# Patient Record
Sex: Female | Born: 1945
Health system: Southern US, Community
[De-identification: ages and names within clinical notes are randomized; demographics above are authoritative.]

## PROBLEM LIST (undated history)

## (undated) DIAGNOSIS — I6523 Occlusion and stenosis of bilateral carotid arteries: Secondary | ICD-10-CM

## (undated) DIAGNOSIS — H348122 Central retinal vein occlusion, left eye, stable: Secondary | ICD-10-CM

## (undated) DIAGNOSIS — E538 Deficiency of other specified B group vitamins: Secondary | ICD-10-CM

## (undated) DIAGNOSIS — E785 Hyperlipidemia, unspecified: Secondary | ICD-10-CM

## (undated) DIAGNOSIS — E039 Hypothyroidism, unspecified: Secondary | ICD-10-CM

## (undated) HISTORY — PX: THYROID SURGERY: SHX805

## (undated) HISTORY — DX: Hyperlipidemia, unspecified: E78.5

## (undated) HISTORY — DX: Occlusion and stenosis of bilateral carotid arteries: I65.23

## (undated) HISTORY — PX: OTHER SURGICAL HISTORY: SHX169

## (undated) HISTORY — DX: Deficiency of other specified B group vitamins: E53.8

## (undated) HISTORY — PX: ABDOMINAL HYSTERECTOMY: SHX81

## (undated) HISTORY — DX: Hypothyroidism, unspecified: E03.9

## (undated) HISTORY — DX: Central retinal vein occlusion, left eye, stable: H34.8122

---

## 2011-09-05 ENCOUNTER — Other Ambulatory Visit: Payer: Self-pay | Admitting: Family Medicine

## 2011-09-05 DIAGNOSIS — I6529 Occlusion and stenosis of unspecified carotid artery: Secondary | ICD-10-CM

## 2011-09-08 ENCOUNTER — Ambulatory Visit (HOSPITAL_COMMUNITY)
Admission: RE | Admit: 2011-09-08 | Discharge: 2011-09-08 | Disposition: A | Discharge status: Home / Self Care | Payer: Medicare HMO | Source: Ambulatory Visit | Attending: Family Medicine | Admitting: Family Medicine

## 2011-09-08 DIAGNOSIS — I6529 Occlusion and stenosis of unspecified carotid artery: Secondary | ICD-10-CM

## 2011-09-08 DIAGNOSIS — R0989 Other specified symptoms and signs involving the circulatory and respiratory systems: Secondary | ICD-10-CM | POA: Insufficient documentation

## 2011-09-08 DIAGNOSIS — F17200 Nicotine dependence, unspecified, uncomplicated: Secondary | ICD-10-CM | POA: Insufficient documentation

## 2013-01-07 ENCOUNTER — Other Ambulatory Visit: Payer: Self-pay | Admitting: *Deleted

## 2013-01-07 DIAGNOSIS — Z780 Asymptomatic menopausal state: Secondary | ICD-10-CM

## 2013-01-31 ENCOUNTER — Encounter: Payer: Self-pay | Admitting: *Deleted

## 2013-03-20 ENCOUNTER — Other Ambulatory Visit: Payer: Self-pay

## 2013-03-20 ENCOUNTER — Ambulatory Visit: Payer: Self-pay

## 2013-04-17 ENCOUNTER — Ambulatory Visit (INDEPENDENT_AMBULATORY_CARE_PROVIDER_SITE_OTHER): Payer: Medicare PPO

## 2013-04-17 ENCOUNTER — Ambulatory Visit (INDEPENDENT_AMBULATORY_CARE_PROVIDER_SITE_OTHER): Payer: Medicare PPO | Admitting: Pharmacist

## 2013-04-17 VITALS — Ht 62.25 in | Wt 178.0 lb

## 2013-04-17 DIAGNOSIS — M899 Disorder of bone, unspecified: Secondary | ICD-10-CM

## 2013-04-17 DIAGNOSIS — E039 Hypothyroidism, unspecified: Secondary | ICD-10-CM | POA: Insufficient documentation

## 2013-04-17 DIAGNOSIS — Z780 Asymptomatic menopausal state: Secondary | ICD-10-CM

## 2013-04-17 DIAGNOSIS — E785 Hyperlipidemia, unspecified: Secondary | ICD-10-CM | POA: Insufficient documentation

## 2013-04-17 DIAGNOSIS — M8580 Other specified disorders of bone density and structure, unspecified site: Secondary | ICD-10-CM

## 2013-04-17 DIAGNOSIS — E559 Vitamin D deficiency, unspecified: Secondary | ICD-10-CM | POA: Insufficient documentation

## 2013-04-17 DIAGNOSIS — I6523 Occlusion and stenosis of bilateral carotid arteries: Secondary | ICD-10-CM

## 2013-04-17 DIAGNOSIS — I6529 Occlusion and stenosis of unspecified carotid artery: Secondary | ICD-10-CM | POA: Insufficient documentation

## 2013-04-17 LAB — POCT CBC
Granulocyte percent: 64.6 %G (ref 37–80)
HCT, POC: 42.5 % (ref 37.7–47.9)
Hemoglobin: 15.6 g/dL (ref 12.2–16.2)
MCV: 88 fL (ref 80–97)
RBC: 4.8 M/uL (ref 4.04–5.48)
RDW, POC: 13.4 %
WBC: 8.6 10*3/uL (ref 4.6–10.2)

## 2013-04-17 LAB — COMPLETE METABOLIC PANEL WITH GFR
AST: 17 U/L (ref 0–37)
Albumin: 3.9 g/dL (ref 3.5–5.2)
BUN: 13 mg/dL (ref 6–23)
Calcium: 9.6 mg/dL (ref 8.4–10.5)
Chloride: 104 mEq/L (ref 96–112)
Potassium: 4.9 mEq/L (ref 3.5–5.3)
Total Protein: 6.8 g/dL (ref 6.0–8.3)

## 2013-04-17 LAB — THYROID PANEL WITH TSH: T4, Total: 11.7 ug/dL (ref 5.0–12.5)

## 2013-04-17 NOTE — Patient Instructions (Signed)
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
## 2013-04-18 LAB — NMR LIPOPROFILE WITH LIPIDS
Cholesterol, Total: 270 mg/dL — ABNORMAL HIGH (ref <200)
LDL Particle Number: 2480 nmol/L — ABNORMAL HIGH (ref <1000)
Large HDL-P: 3.3 umol/L — ABNORMAL LOW (ref >=4.8)
Large VLDL-P: 27.2 nmol/L — ABNORMAL HIGH (ref <=2.7)
Triglycerides: 396 mg/dL — ABNORMAL HIGH (ref <150)
VLDL Size: 57.6 nm — ABNORMAL HIGH (ref <=46.6)

## 2013-04-18 LAB — VITAMIN D 25 HYDROXY (VIT D DEFICIENCY, FRACTURES): Vit D, 25-Hydroxy: 41 ng/mL (ref 30–89)

## 2013-04-23 ENCOUNTER — Telehealth: Payer: Self-pay | Admitting: Pharmacist

## 2013-04-23 NOTE — Telephone Encounter (Signed)
Left detailed message that it is ok to schedule with [facility] and for her to call and schedule her appt with mmm

## 2013-04-23 NOTE — Telephone Encounter (Signed)
Called patient with laboratory results.  Results were WNL except elevated LDL-P and Tg.   Suggested crestor but patient refused any medication for cholesterol.   Has tried lipitor, niacin and pravachol in past and experienced myalgias.   Patient understands risks of not taking statin.  Patient also reminded that she needs to establish with new PCP.   She would like to see Paulene Floor, NP I will forward these result to Paulene Floor and her nurse to review and make appt .

## 2013-04-23 NOTE — Telephone Encounter (Signed)
Ok to make next appointment with me

## 2013-05-06 ENCOUNTER — Encounter: Payer: Self-pay | Admitting: Nurse Practitioner

## 2013-05-06 ENCOUNTER — Ambulatory Visit (INDEPENDENT_AMBULATORY_CARE_PROVIDER_SITE_OTHER): Payer: Medicare PPO | Admitting: Nurse Practitioner

## 2013-05-06 VITALS — BP 142/74 | HR 54 | Temp 98.1°F | Ht 62.25 in | Wt 181.0 lb

## 2013-05-06 DIAGNOSIS — E785 Hyperlipidemia, unspecified: Secondary | ICD-10-CM

## 2013-05-06 DIAGNOSIS — E039 Hypothyroidism, unspecified: Secondary | ICD-10-CM

## 2013-05-06 MED ORDER — LEVOTHYROXINE SODIUM 125 MCG PO TABS: 125.0000 ug | ORAL_TABLET | Freq: Every day | ORAL | Status: DC

## 2013-05-06 NOTE — Patient Instructions (Signed)
Smoking Cessation Quitting smoking is important to your health and has many advantages. However, it is not always easy to quit since nicotine is a very addictive drug. Often times, people try 3 times or more before being able to quit. This document explains the best ways for you to prepare to quit smoking. Quitting takes hard work and a lot of effort, but you can do it. ADVANTAGES OF QUITTING SMOKING  You will live longer, feel better, and live better.  Your body will feel the impact of quitting smoking almost immediately.  Within 20 minutes, blood pressure decreases. Your pulse returns to its normal level.  After 8 hours, carbon monoxide levels in the blood return to normal. Your oxygen level increases.  After 24 hours, the chance of having a heart attack starts to decrease. Your breath, hair, and body stop smelling like smoke.  After 48 hours, damaged nerve endings begin to recover. Your sense of taste and smell improve.  After 72 hours, the body is virtually free of nicotine. Your bronchial tubes relax and breathing becomes easier.  After 2 to 12 weeks, lungs can hold more air. Exercise becomes easier and circulation improves.  The risk of having a heart attack, stroke, cancer, or lung disease is greatly reduced.  After 1 year, the risk of coronary heart disease is cut in half.  After 5 years, the risk of stroke falls to the same as a nonsmoker.  After 10 years, the risk of lung cancer is cut in half and the risk of other cancers decreases significantly.  After 15 years, the risk of coronary heart disease drops, usually to the level of a nonsmoker.  If you are pregnant, quitting smoking will improve your chances of having a healthy baby.  The people you live with, especially any children, will be healthier.  You will have extra money to spend on things other than cigarettes. QUESTIONS TO THINK ABOUT BEFORE ATTEMPTING TO QUIT You may want to talk about your answers with your  caregiver.  Why do you want to quit?  If you tried to quit in the past, what helped and what did not?  What will be the most difficult situations for you after you quit? How will you plan to handle them?  Who can help you through the tough times? Your family? Friends? A caregiver?  What pleasures do you get from smoking? What ways can you still get pleasure if you quit? Here are some questions to ask your caregiver:  How can you help me to be successful at quitting?  What medicine do you think would be best for me and how should I take it?  What should I do if I need more help?  What is smoking withdrawal like? How can I get information on withdrawal? GET READY  Set a quit date.  Change your environment by getting rid of all cigarettes, ashtrays, matches, and lighters in your home, car, or work. Do not let people smoke in your home.  Review your past attempts to quit. Think about what worked and what did not. GET SUPPORT AND ENCOURAGEMENT You have a better chance of being successful if you have help. You can get support in many ways.  Tell your family, friends, and co-workers that you are going to quit and need their support. Ask them not to smoke around you.  Get individual, group, or telephone counseling and support. Programs are available at local hospitals and health centers. Call your local health department for   information about programs in your area.  Spiritual beliefs and practices may help some smokers quit.  Download a "quit meter" on your computer to keep track of quit statistics, such as how long you have gone without smoking, cigarettes not smoked, and money saved.  Get a self-help book about quitting smoking and staying off of tobacco. LEARN NEW SKILLS AND BEHAVIORS  Distract yourself from urges to smoke. Talk to someone, go for a walk, or occupy your time with a task.  Change your normal routine. Take a different route to work. Drink tea instead of coffee.  Eat breakfast in a different place.  Reduce your stress. Take a hot bath, exercise, or read a book.  Plan something enjoyable to do every day. Reward yourself for not smoking.  Explore interactive web-based programs that specialize in helping you quit. GET MEDICINE AND USE IT CORRECTLY Medicines can help you stop smoking and decrease the urge to smoke. Combining medicine with the above behavioral methods and support can greatly increase your chances of successfully quitting smoking.  Nicotine replacement therapy helps deliver nicotine to your body without the negative effects and risks of smoking. Nicotine replacement therapy includes nicotine gum, lozenges, inhalers, nasal sprays, and skin patches. Some may be available over-the-counter and others require a prescription.  Antidepressant medicine helps people abstain from smoking, but how this works is unknown. This medicine is available by prescription.  Nicotinic receptor partial agonist medicine simulates the effect of nicotine in your brain. This medicine is available by prescription. Ask your caregiver for advice about which medicines to use and how to use them based on your health history. Your caregiver will tell you what side effects to look out for if you choose to be on a medicine or therapy. Carefully read the information on the package. Do not use any other product containing nicotine while using a nicotine replacement product.  RELAPSE OR DIFFICULT SITUATIONS Most relapses occur within the first 3 months after quitting. Do not be discouraged if you start smoking again. Remember, most people try several times before finally quitting. You may have symptoms of withdrawal because your body is used to nicotine. You may crave cigarettes, be irritable, feel very hungry, cough often, get headaches, or have difficulty concentrating. The withdrawal symptoms are only temporary. They are strongest when you first quit, but they will go away within  10 14 days. To reduce the chances of relapse, try to:  Avoid drinking alcohol. Drinking lowers your chances of successfully quitting.  Reduce the amount of caffeine you consume. Once you quit smoking, the amount of caffeine in your body increases and can give you symptoms, such as a rapid heartbeat, sweating, and anxiety.  Avoid smokers because they can make you want to smoke.  Do not let weight gain distract you. Many smokers will gain weight when they quit, usually less than 10 pounds. Eat a healthy diet and stay active. You can always lose the weight gained after you quit.  Find ways to improve your mood other than smoking. FOR MORE INFORMATION  www.smokefree.gov  Document Released: 10/04/2001 Document Revised: 04/10/2012 Document Reviewed: 01/19/2012 [employer] Patient Information 2014 [employer].  

## 2013-05-06 NOTE — Progress Notes (Signed)
  Subjective:    Patient ID: Melinda Mitchell, female    DOB: 08/08/1946, 67 y.o.   MRN: [id number]  Hyperlipidemia This is a chronic problem. The current episode started more than 1 year ago. The problem is controlled. Recent lipid tests were reviewed and are normal. Exacerbating diseases include hypothyroidism and obesity. She has no history of diabetes or liver disease. Associated symptoms include a focal sensory loss. Pertinent negatives include no focal weakness, leg pain, myalgias or shortness of breath. Treatments tried: CANNOT TOLERATE STATIN. The current treatment provides no improvement of lipids. Risk factors for coronary artery disease include female sex, obesity and post-menopausal.  Thyroid Problem Presents for follow-up (hypothyroidism) visit. Symptoms include menstrual problem. Patient reports no anxiety, cold intolerance, depressed mood, diaphoresis, diarrhea, heat intolerance, leg swelling, palpitations, visual change or weight loss. The symptoms have been stable. Her past medical history is significant for hyperlipidemia. There is no history of diabetes.      Review of Systems  Constitutional: Negative for weight loss and diaphoresis.  Respiratory: Negative for shortness of breath.   Cardiovascular: Negative for palpitations.  Gastrointestinal: Negative for diarrhea.  Endocrine: Negative for cold intolerance and heat intolerance.  Genitourinary: Positive for menstrual problem.  Musculoskeletal: Negative for myalgias.  Neurological: Negative for focal weakness.  All other systems reviewed and are negative.       Objective:   Physical Exam  Constitutional: She is oriented to person, place, and time. She appears well-developed and well-nourished.  HENT:  Nose: Nose normal.  Mouth/Throat: Oropharynx is clear and moist.  Eyes: EOM are normal.  Neck: Trachea normal, normal range of motion and full passive range of motion without pain. Neck supple. No JVD present. Carotid bruit is  not present. No thyromegaly present.  Cardiovascular: Normal rate, regular rhythm, normal heart sounds and intact distal pulses.  Exam reveals no gallop and no friction rub.   No murmur heard. Pulmonary/Chest: Effort normal and breath sounds normal.  Abdominal: Soft. Bowel sounds are normal. She exhibits no distension and no mass. There is no tenderness.  Musculoskeletal: Normal range of motion.  Lymphadenopathy:    She has no cervical adenopathy.  Neurological: She is alert and oriented to person, place, and time. She has normal reflexes.  Skin: Skin is warm and dry.  Psychiatric: She has a normal mood and affect. Her behavior is normal. Judgment and thought content normal.    BP 142/74  Pulse 54  Temp(Src) 98.1 F (36.7 C) (Oral)  Ht 5' 2.25" (1.581 m)  Wt 181 lb (82.101 kg)  BMI 32.85 kg/m2       Assessment & Plan:   1. Hyperlipidemia   2. Hypothyroidism     Meds ordered this encounter  Medications  . levothyroxine (SYNTHROID, LEVOTHROID) 125 MCG tablet    Sig: Take 1 tablet (125 mcg total) by mouth daily before breakfast.    Dispense:  90 tablet    Refill:  1    Order Specific Question:  Supervising Provider    Answer:  Ernestina Penna [[id number]]  reviewed med results at appointment Continue current meds Since can't tolerate statin due to pain Strict low fat diet and exercise Follow up in 3 months  Mary-Margaret Daphine Deutscher, FNP

## 2013-05-29 ENCOUNTER — Other Ambulatory Visit: Payer: Self-pay

## 2013-08-29 ENCOUNTER — Other Ambulatory Visit: Payer: Self-pay

## 2013-10-16 ENCOUNTER — Ambulatory Visit (INDEPENDENT_AMBULATORY_CARE_PROVIDER_SITE_OTHER): Payer: Medicare PPO | Admitting: Nurse Practitioner

## 2013-10-16 ENCOUNTER — Encounter: Payer: Self-pay | Admitting: Nurse Practitioner

## 2013-10-16 VITALS — BP 152/78 | HR 70 | Temp 98.5°F | Ht 62.0 in | Wt 191.0 lb

## 2013-10-16 DIAGNOSIS — I658 Occlusion and stenosis of other precerebral arteries: Secondary | ICD-10-CM

## 2013-10-16 DIAGNOSIS — I6529 Occlusion and stenosis of unspecified carotid artery: Secondary | ICD-10-CM

## 2013-10-16 DIAGNOSIS — E039 Hypothyroidism, unspecified: Secondary | ICD-10-CM

## 2013-10-16 DIAGNOSIS — E785 Hyperlipidemia, unspecified: Secondary | ICD-10-CM

## 2013-10-16 DIAGNOSIS — Z23 Encounter for immunization: Secondary | ICD-10-CM

## 2013-10-16 DIAGNOSIS — I6523 Occlusion and stenosis of bilateral carotid arteries: Secondary | ICD-10-CM

## 2013-10-16 DIAGNOSIS — E559 Vitamin D deficiency, unspecified: Secondary | ICD-10-CM

## 2013-10-16 DIAGNOSIS — R5381 Other malaise: Secondary | ICD-10-CM

## 2013-10-16 DIAGNOSIS — J449 Chronic obstructive pulmonary disease, unspecified: Secondary | ICD-10-CM

## 2013-10-16 MED ORDER — ALBUTEROL SULFATE HFA 108 (90 BASE) MCG/ACT IN AERS: 2.0000 | INHALATION_SPRAY | Freq: Four times a day (QID) | RESPIRATORY_TRACT | Status: DC | PRN

## 2013-10-16 MED ORDER — LEVOTHYROXINE SODIUM 125 MCG PO TABS: 125.0000 ug | ORAL_TABLET | Freq: Every day | ORAL | Status: DC

## 2013-10-16 NOTE — Progress Notes (Signed)
Subjective:    Patient ID: Melinda Mitchell, female    DOB: 07-08-1946, 67 y.o.   MRN: [id number]  Patient here today for follow up- doing well- only complaint since last visit is fatigue- says that she is having to take a nap daily.  Hyperlipidemia This is a chronic problem. The current episode started more than 1 year ago. The problem is controlled. Recent lipid tests were reviewed and are normal. Exacerbating diseases include hypothyroidism and obesity. She has no history of diabetes or liver disease. Associated symptoms include a focal sensory loss. Pertinent negatives include no focal weakness, leg pain, myalgias or shortness of breath. Treatments tried: CANNOT TOLERATE STATIN. The current treatment provides no improvement of lipids. Risk factors for coronary artery disease include female sex, obesity and post-menopausal.  Thyroid Problem Presents for follow-up (hypothyroidism) visit. Symptoms include menstrual problem. Patient reports no anxiety, cold intolerance, depressed mood, diaphoresis, diarrhea, heat intolerance, leg swelling, palpitations, visual change or weight loss. The symptoms have been stable. Her past medical history is significant for hyperlipidemia. There is no history of diabetes.      Review of Systems  Constitutional: Negative for weight loss and diaphoresis.  Respiratory: Negative for shortness of breath.   Cardiovascular: Negative for palpitations.  Gastrointestinal: Negative for diarrhea.  Endocrine: Negative for cold intolerance and heat intolerance.  Genitourinary: Positive for menstrual problem.  Musculoskeletal: Negative for myalgias.  Neurological: Negative for focal weakness.  All other systems reviewed and are negative.       Objective:   Physical Exam  Constitutional: She is oriented to person, place, and time. She appears well-developed and well-nourished.  HENT:  Nose: Nose normal.  Mouth/Throat: Oropharynx is clear and moist.  Eyes: EOM are normal.   Neck: Trachea normal, normal range of motion and full passive range of motion without pain. Neck supple. No JVD present. Carotid bruit is not present. No thyromegaly present.  Cardiovascular: Normal rate, regular rhythm, normal heart sounds and intact distal pulses.  Exam reveals no gallop and no friction rub.   No murmur heard. Pulmonary/Chest: Effort normal. She has wheezes (expiratory throughout).  Abdominal: Soft. Bowel sounds are normal. She exhibits no distension and no mass. There is no tenderness.  Musculoskeletal: Normal range of motion.  Lymphadenopathy:    She has no cervical adenopathy.  Neurological: She is alert and oriented to person, place, and time. She has normal reflexes.  Skin: Skin is warm and dry.  Psychiatric: She has a normal mood and affect. Her behavior is normal. Judgment and thought content normal.    BP 152/78  Pulse 70  Temp(Src) 98.5 F (36.9 C) (Oral)  Ht 5\' 2"  (1.575 m)  Wt 191 lb (86.637 kg)  BMI 34.93 kg/m2       Assessment & Plan:   1. Hyperlipidemia   2. Hypothyroidism (acquired)   3. Vitamin D insufficiency   4. Carotid artery stenosis, bilateral   5. COPD, mild   6. Hypothyroidism   7. Other malaise and fatigue    Orders Placed This Encounter  Procedures  . CMP14+EGFR  . NMR, lipoprofile  . Thyroid Panel With TSH  . Anemia Profile B   Meds ordered this encounter  Medications  . calcium carbonate 200 MG capsule    Sig: Take 250 mg by mouth 2 (two) times daily with a meal.  . levothyroxine (SYNTHROID, LEVOTHROID) 125 MCG tablet    Sig: Take 1 tablet (125 mcg total) by mouth daily before breakfast.    Dispense:  90 tablet    Refill:  1    Order Specific Question:  Supervising Provider    Answer:  Ernestina Penna [[id number]]  . albuterol (PROVENTIL HFA;VENTOLIN HFA) 108 (90 BASE) MCG/ACT inhaler    Sig: Inhale 2 puffs into the lungs every 6 (six) hours as needed for wheezing or shortness of breath.    Dispense:  1 Inhaler     Refill:  1    Order Specific Question:  Supervising Provider    Answer:  Deborra Medina    Continue all meds Labs pending Diet and exercise encouraged Health maintenance reviewed Follow up in 3 months Mammogram appointment to be made Pneumonia vaccine today  Mary-Margaret Daphine Deutscher, FNP

## 2013-10-16 NOTE — Patient Instructions (Signed)
Smoking Cessation Quitting smoking is important to your health and has many advantages. However, it is not always easy to quit since nicotine is a very addictive drug. Often times, people try 3 times or more before being able to quit. This document explains the best ways for you to prepare to quit smoking. Quitting takes hard work and a lot of effort, but you can do it. ADVANTAGES OF QUITTING SMOKING  You will live longer, feel better, and live better.  Your body will feel the impact of quitting smoking almost immediately.  Within 20 minutes, blood pressure decreases. Your pulse returns to its normal level.  After 8 hours, carbon monoxide levels in the blood return to normal. Your oxygen level increases.  After 24 hours, the chance of having a heart attack starts to decrease. Your breath, hair, and body stop smelling like smoke.  After 48 hours, damaged nerve endings begin to recover. Your sense of taste and smell improve.  After 72 hours, the body is virtually free of nicotine. Your bronchial tubes relax and breathing becomes easier.  After 2 to 12 weeks, lungs can hold more air. Exercise becomes easier and circulation improves.  The risk of having a heart attack, stroke, cancer, or lung disease is greatly reduced.  After 1 year, the risk of coronary heart disease is cut in half.  After 5 years, the risk of stroke falls to the same as a nonsmoker.  After 10 years, the risk of lung cancer is cut in half and the risk of other cancers decreases significantly.  After 15 years, the risk of coronary heart disease drops, usually to the level of a nonsmoker.  If you are pregnant, quitting smoking will improve your chances of having a healthy baby.  The people you live with, especially any children, will be healthier.  You will have extra money to spend on things other than cigarettes. QUESTIONS TO THINK ABOUT BEFORE ATTEMPTING TO QUIT You may want to talk about your answers with your  caregiver.  Why do you want to quit?  If you tried to quit in the past, what helped and what did not?  What will be the most difficult situations for you after you quit? How will you plan to handle them?  Who can help you through the tough times? Your family? Friends? A caregiver?  What pleasures do you get from smoking? What ways can you still get pleasure if you quit? Here are some questions to ask your caregiver:  How can you help me to be successful at quitting?  What medicine do you think would be best for me and how should I take it?  What should I do if I need more help?  What is smoking withdrawal like? How can I get information on withdrawal? GET READY  Set a quit date.  Change your environment by getting rid of all cigarettes, ashtrays, matches, and lighters in your home, car, or work. Do not let people smoke in your home.  Review your past attempts to quit. Think about what worked and what did not. GET SUPPORT AND ENCOURAGEMENT You have a better chance of being successful if you have help. You can get support in many ways.  Tell your family, friends, and co-workers that you are going to quit and need their support. Ask them not to smoke around you.  Get individual, group, or telephone counseling and support. Programs are available at local hospitals and health centers. Call your local health department for   information about programs in your area.  Spiritual beliefs and practices may help some smokers quit.  Download a "quit meter" on your computer to keep track of quit statistics, such as how long you have gone without smoking, cigarettes not smoked, and money saved.  Get a self-help book about quitting smoking and staying off of tobacco. LEARN NEW SKILLS AND BEHAVIORS  Distract yourself from urges to smoke. Talk to someone, go for a walk, or occupy your time with a task.  Change your normal routine. Take a different route to work. Drink tea instead of coffee.  Eat breakfast in a different place.  Reduce your stress. Take a hot bath, exercise, or read a book.  Plan something enjoyable to do every day. Reward yourself for not smoking.  Explore interactive web-based programs that specialize in helping you quit. GET MEDICINE AND USE IT CORRECTLY Medicines can help you stop smoking and decrease the urge to smoke. Combining medicine with the above behavioral methods and support can greatly increase your chances of successfully quitting smoking.  Nicotine replacement therapy helps deliver nicotine to your body without the negative effects and risks of smoking. Nicotine replacement therapy includes nicotine gum, lozenges, inhalers, nasal sprays, and skin patches. Some may be available over-the-counter and others require a prescription.  Antidepressant medicine helps people abstain from smoking, but how this works is unknown. This medicine is available by prescription.  Nicotinic receptor partial agonist medicine simulates the effect of nicotine in your brain. This medicine is available by prescription. Ask your caregiver for advice about which medicines to use and how to use them based on your health history. Your caregiver will tell you what side effects to look out for if you choose to be on a medicine or therapy. Carefully read the information on the package. Do not use any other product containing nicotine while using a nicotine replacement product.  RELAPSE OR DIFFICULT SITUATIONS Most relapses occur within the first 3 months after quitting. Do not be discouraged if you start smoking again. Remember, most people try several times before finally quitting. You may have symptoms of withdrawal because your body is used to nicotine. You may crave cigarettes, be irritable, feel very hungry, cough often, get headaches, or have difficulty concentrating. The withdrawal symptoms are only temporary. They are strongest when you first quit, but they will go away within  10 14 days. To reduce the chances of relapse, try to:  Avoid drinking alcohol. Drinking lowers your chances of successfully quitting.  Reduce the amount of caffeine you consume. Once you quit smoking, the amount of caffeine in your body increases and can give you symptoms, such as a rapid heartbeat, sweating, and anxiety.  Avoid smokers because they can make you want to smoke.  Do not let weight gain distract you. Many smokers will gain weight when they quit, usually less than 10 pounds. Eat a healthy diet and stay active. You can always lose the weight gained after you quit.  Find ways to improve your mood other than smoking. FOR MORE INFORMATION  www.smokefree.gov  Document Released: 10/04/2001 Document Revised: 04/10/2012 Document Reviewed: 01/19/2012 [employer] Patient Information 2014 [employer].  

## 2013-10-19 LAB — CMP14+EGFR
Albumin/Globulin Ratio: 2.3 (ref 1.1–2.5)
Albumin: 4.4 g/dL (ref 3.6–4.8)
BUN: 15 mg/dL (ref 8–27)
GFR calc Af Amer: 73 mL/min/{1.73_m2} (ref >59)
GFR calc non Af Amer: 63 mL/min/{1.73_m2} (ref >59)
Glucose: 122 mg/dL — ABNORMAL HIGH (ref 65–99)
Potassium: 4.5 mmol/L (ref 3.5–5.2)
Total Bilirubin: 0.2 mg/dL (ref 0.0–1.2)
Total Protein: 6.3 g/dL (ref 6.0–8.5)

## 2013-10-19 LAB — ANEMIA PROFILE B
Basos: 1 %
Eosinophils Absolute: 0.2 10*3/uL (ref 0.0–0.4)
Ferritin: 110 ng/mL (ref 15–150)
Immature Grans (Abs): 0 10*3/uL (ref 0.0–0.1)
Immature Granulocytes: 0 %
Iron: 69 ug/dL (ref 35–155)
MCH: 32.3 pg (ref 26.6–33.0)
MCHC: 36.6 g/dL — ABNORMAL HIGH (ref 31.5–35.7)
Monocytes Absolute: 0.4 10*3/uL (ref 0.1–0.9)
Neutrophils Relative %: 58 %
Platelets: 250 10*3/uL (ref 150–379)
UIBC: 255 ug/dL (ref 150–375)
Vitamin B-12: 1753 pg/mL — ABNORMAL HIGH (ref 211–946)

## 2013-10-19 LAB — NMR, LIPOPROFILE
Cholesterol: 276 mg/dL — ABNORMAL HIGH (ref <200)
LDL Size: 20.1 nm — ABNORMAL LOW (ref >20.5)
Small LDL Particle Number: 1517 nmol/L — ABNORMAL HIGH (ref <=527)
Triglycerides by NMR: 611 mg/dL — ABNORMAL HIGH (ref <150)

## 2013-10-28 ENCOUNTER — Telehealth: Payer: Self-pay | Admitting: Nurse Practitioner

## 2013-10-28 DIAGNOSIS — E039 Hypothyroidism, unspecified: Secondary | ICD-10-CM

## 2013-10-31 MED ORDER — LEVOTHYROXINE SODIUM 125 MCG PO TABS: 125.0000 ug | ORAL_TABLET | Freq: Every day | ORAL | Status: DC

## 2013-10-31 NOTE — Telephone Encounter (Signed)
done

## 2014-04-22 ENCOUNTER — Telehealth: Payer: Self-pay

## 2014-04-22 MED ORDER — LEVOTHYROXINE SODIUM 125 MCG PO TABS: 125.0000 ug | ORAL_TABLET | Freq: Every day | ORAL | Status: DC

## 2014-04-22 NOTE — Telephone Encounter (Signed)
x

## 2014-05-21 ENCOUNTER — Encounter: Payer: Self-pay | Admitting: Nurse Practitioner

## 2014-05-21 ENCOUNTER — Ambulatory Visit (INDEPENDENT_AMBULATORY_CARE_PROVIDER_SITE_OTHER): Payer: Medicare PPO | Admitting: Nurse Practitioner

## 2014-05-21 VITALS — BP 135/75 | HR 89 | Temp 98.4°F | Ht 62.0 in | Wt 195.0 lb

## 2014-05-21 DIAGNOSIS — E785 Hyperlipidemia, unspecified: Secondary | ICD-10-CM

## 2014-05-21 DIAGNOSIS — I658 Occlusion and stenosis of other precerebral arteries: Secondary | ICD-10-CM

## 2014-05-21 DIAGNOSIS — M545 Low back pain, unspecified: Secondary | ICD-10-CM

## 2014-05-21 DIAGNOSIS — I6529 Occlusion and stenosis of unspecified carotid artery: Secondary | ICD-10-CM

## 2014-05-21 DIAGNOSIS — I6523 Occlusion and stenosis of bilateral carotid arteries: Secondary | ICD-10-CM

## 2014-05-21 DIAGNOSIS — E559 Vitamin D deficiency, unspecified: Secondary | ICD-10-CM

## 2014-05-21 DIAGNOSIS — E039 Hypothyroidism, unspecified: Secondary | ICD-10-CM

## 2014-05-21 MED ORDER — CYCLOBENZAPRINE HCL 5 MG PO TABS: 5.0000 mg | ORAL_TABLET | Freq: Three times a day (TID) | ORAL | Status: DC | PRN

## 2014-05-21 MED ORDER — METHYLPREDNISOLONE ACETATE 80 MG/ML IJ SUSP
80.0000 mg | Freq: Once | INTRAMUSCULAR | Status: AC
  Administered 2014-05-21: 80 mg via INTRAMUSCULAR

## 2014-05-21 NOTE — Patient Instructions (Signed)
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
## 2014-05-22 LAB — CMP14+EGFR
A/G RATIO: 1.7 (ref 1.1–2.5)
ALBUMIN: 4.1 g/dL (ref 3.6–4.8)
ALK PHOS: 78 IU/L (ref 39–117)
ALT: 15 IU/L (ref 0–32)
AST: 15 IU/L (ref 0–40)
BILIRUBIN TOTAL: 0.3 mg/dL (ref 0.0–1.2)
BUN / CREAT RATIO: 17 (ref 11–26)
BUN: 13 mg/dL (ref 8–27)
CO2: 20 mmol/L (ref 18–29)
Calcium: 9.3 mg/dL (ref 8.7–10.3)
Chloride: 102 mmol/L (ref 97–108)
Creatinine, Ser: 0.76 mg/dL (ref 0.57–1.00)
GFR, EST AFRICAN AMERICAN: 93 mL/min/{1.73_m2} (ref >59)
GFR, EST NON AFRICAN AMERICAN: 81 mL/min/{1.73_m2} (ref >59)
GLUCOSE: 116 mg/dL — AB (ref 65–99)
Globulin, Total: 2.4 g/dL (ref 1.5–4.5)
POTASSIUM: 4 mmol/L (ref 3.5–5.2)
Sodium: 140 mmol/L (ref 134–144)
TOTAL PROTEIN: 6.5 g/dL (ref 6.0–8.5)

## 2014-05-22 LAB — THYROID PANEL WITH TSH
Free Thyroxine Index: 3.1 (ref 1.2–4.9)
T3 Uptake Ratio: 29 % (ref 24–39)
T4, Total: 10.6 ug/dL (ref 4.5–12.0)
TSH: 0.547 u[IU]/mL (ref 0.450–4.500)

## 2014-05-22 LAB — NMR, LIPOPROFILE
CHOLESTEROL: 268 mg/dL — AB (ref 100–199)
HDL Cholesterol by NMR: 41 mg/dL (ref >39)
HDL Particle Number: 41.6 umol/L (ref >=30.5)
LDL PARTICLE NUMBER: 1783 nmol/L — AB (ref <1000)
LDL SIZE: 19.5 nm (ref >20.5)
LP-IR SCORE: 97 — AB (ref <=45)
SMALL LDL PARTICLE NUMBER: 1157 nmol/L — AB (ref <=527)
Triglycerides by NMR: 639 mg/dL (ref 0–149)

## 2014-05-23 ENCOUNTER — Other Ambulatory Visit: Payer: Self-pay | Admitting: Nurse Practitioner

## 2014-05-23 MED ORDER — FENOFIBRATE 160 MG PO TABS: 160.0000 mg | ORAL_TABLET | Freq: Every day | ORAL | Status: DC

## 2014-06-04 IMAGING — CR DG SHOULDER 2+V*R*
3 series · 3 of 3 positions shown · non-contrast
Comparison: None

CLINICAL DATA: Pain, popping and cracking for 5 months

RIGHT SHOULDER - 2+ VIEW

[view not recorded (1 of 3)]
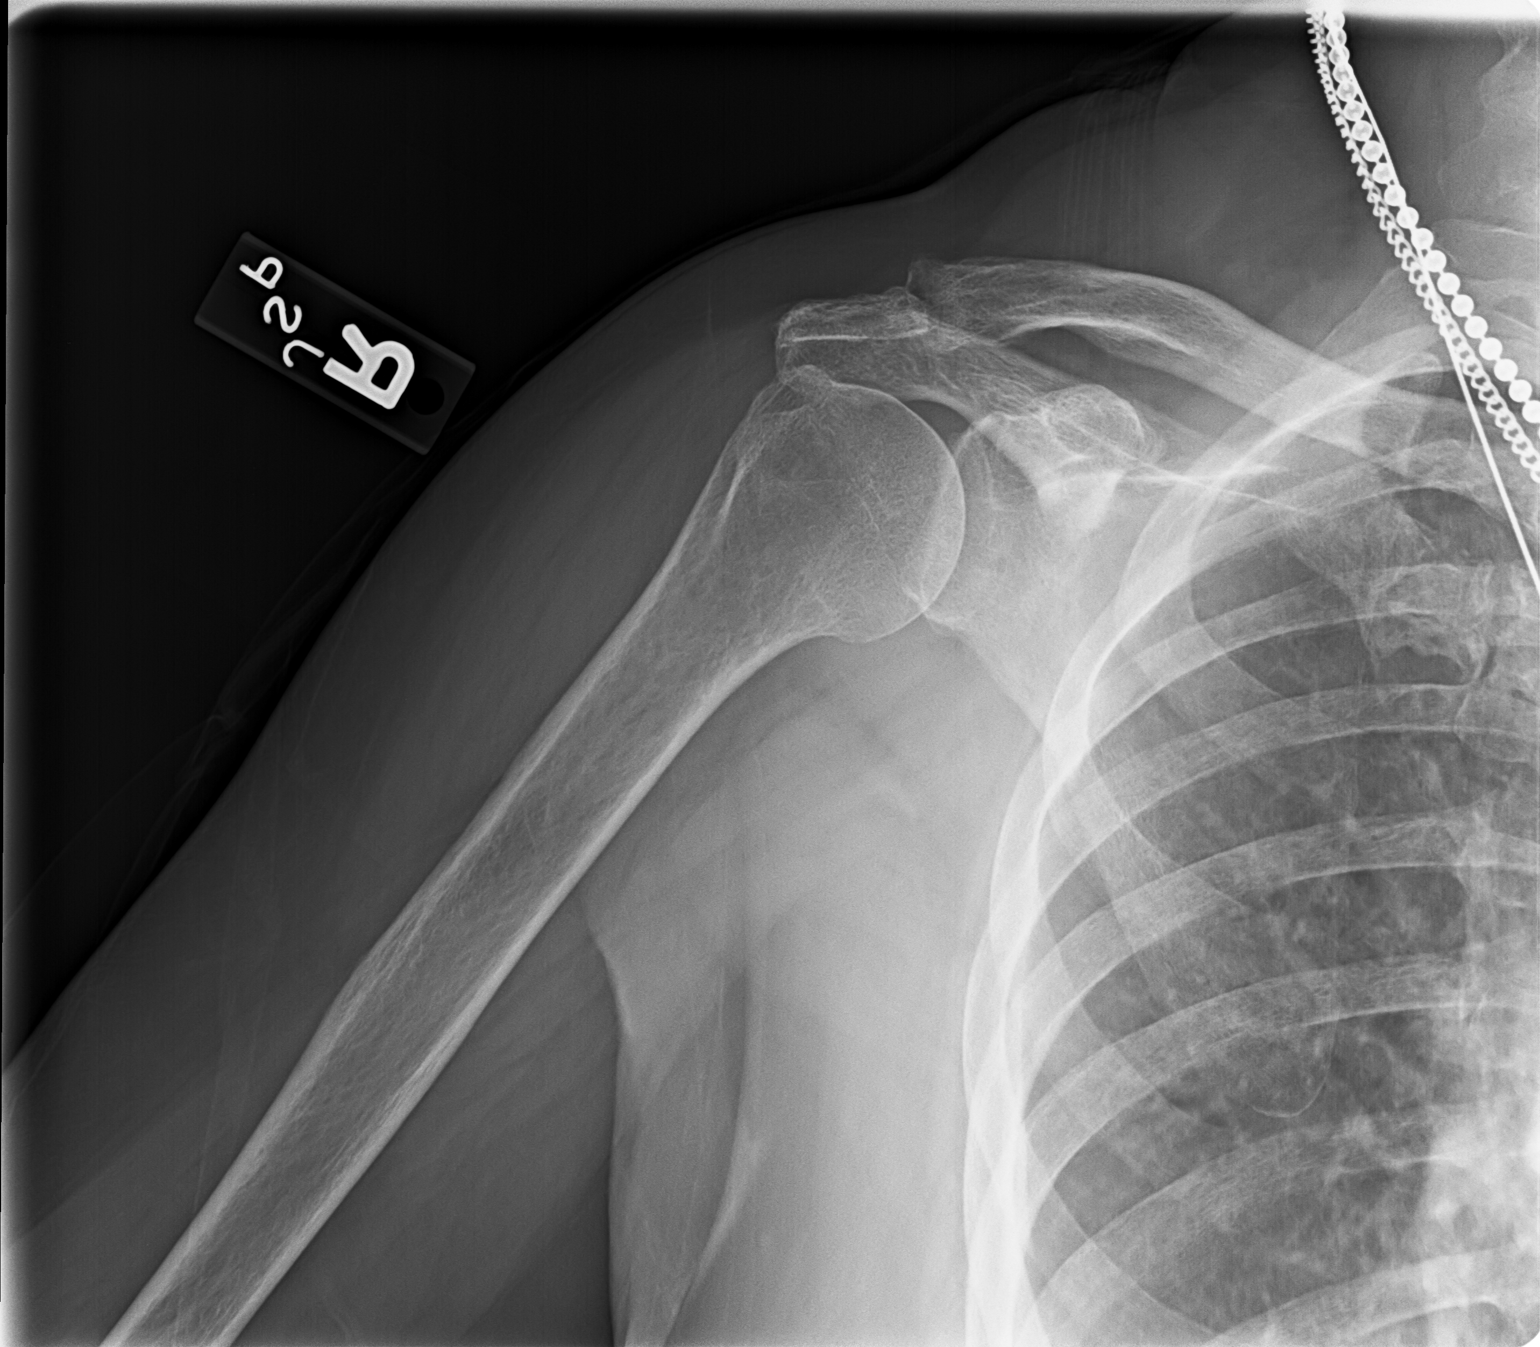

[view not recorded (2 of 3)]
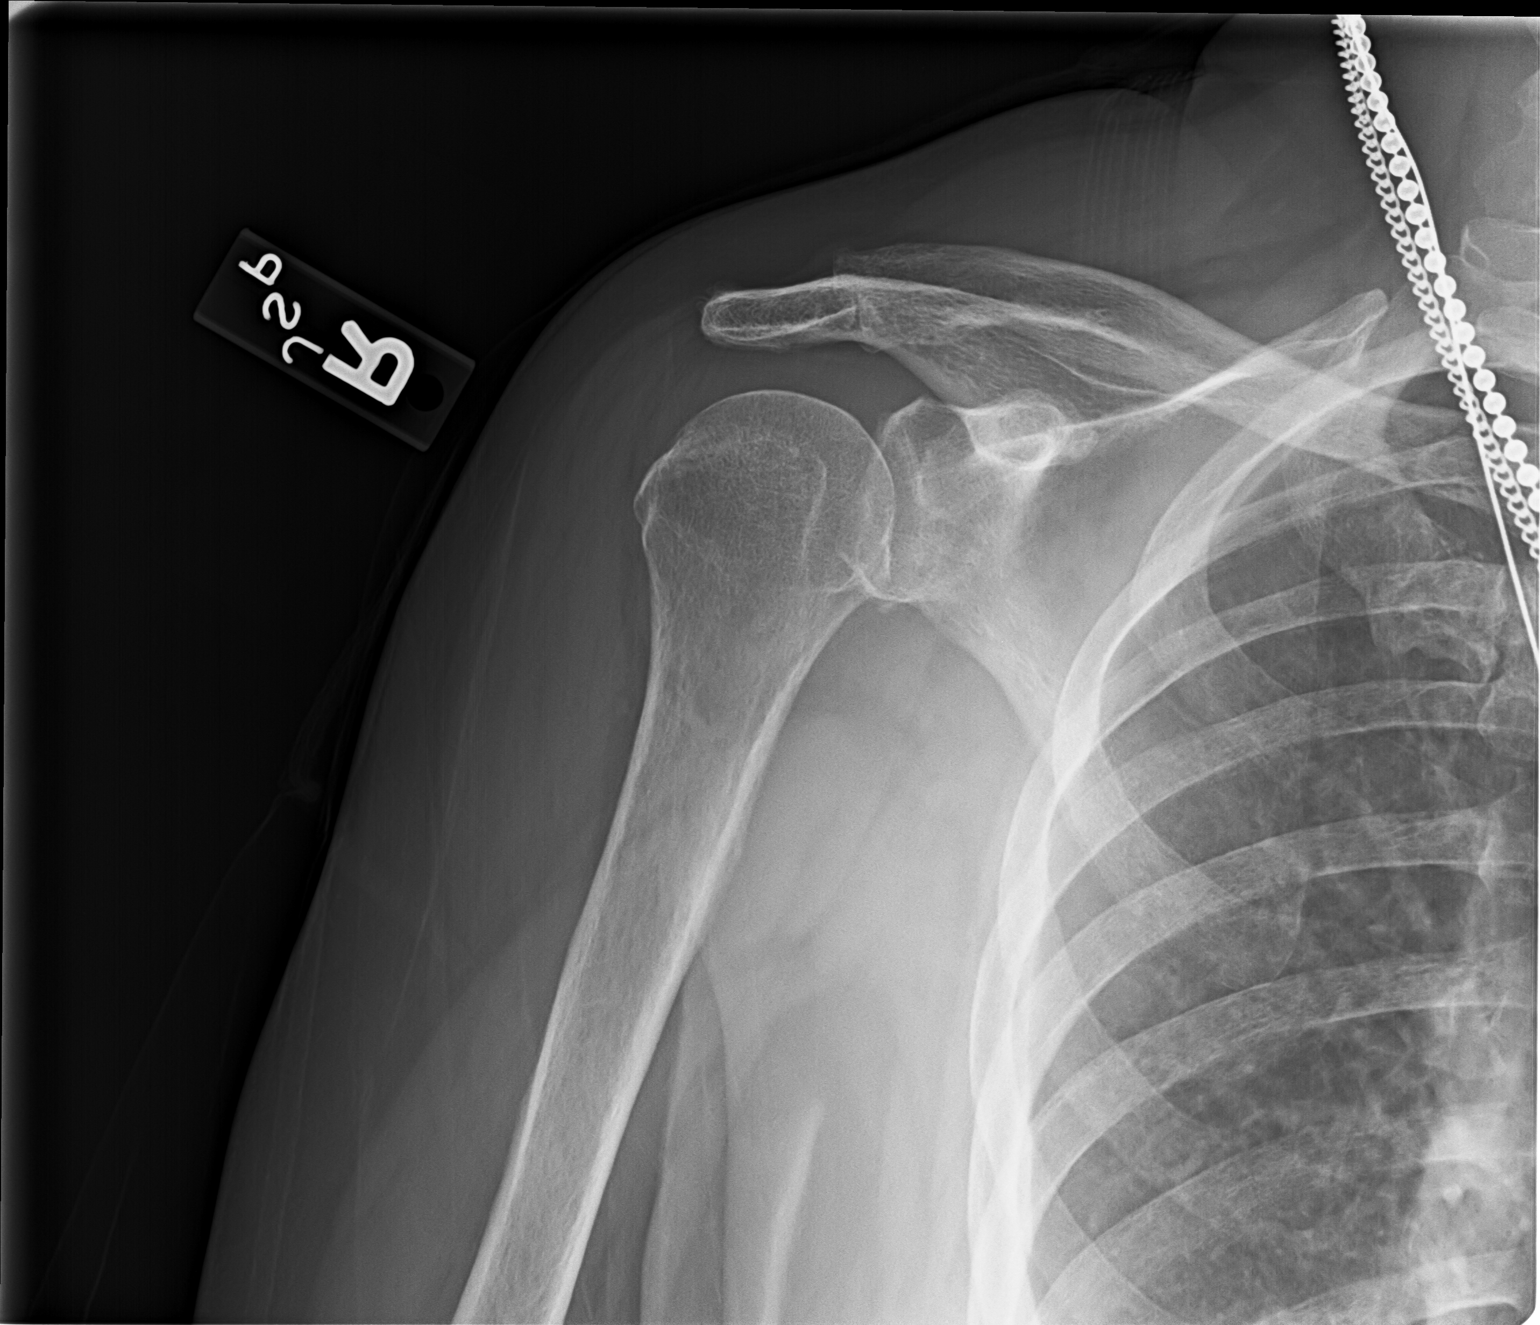

[view not recorded (3 of 3)]
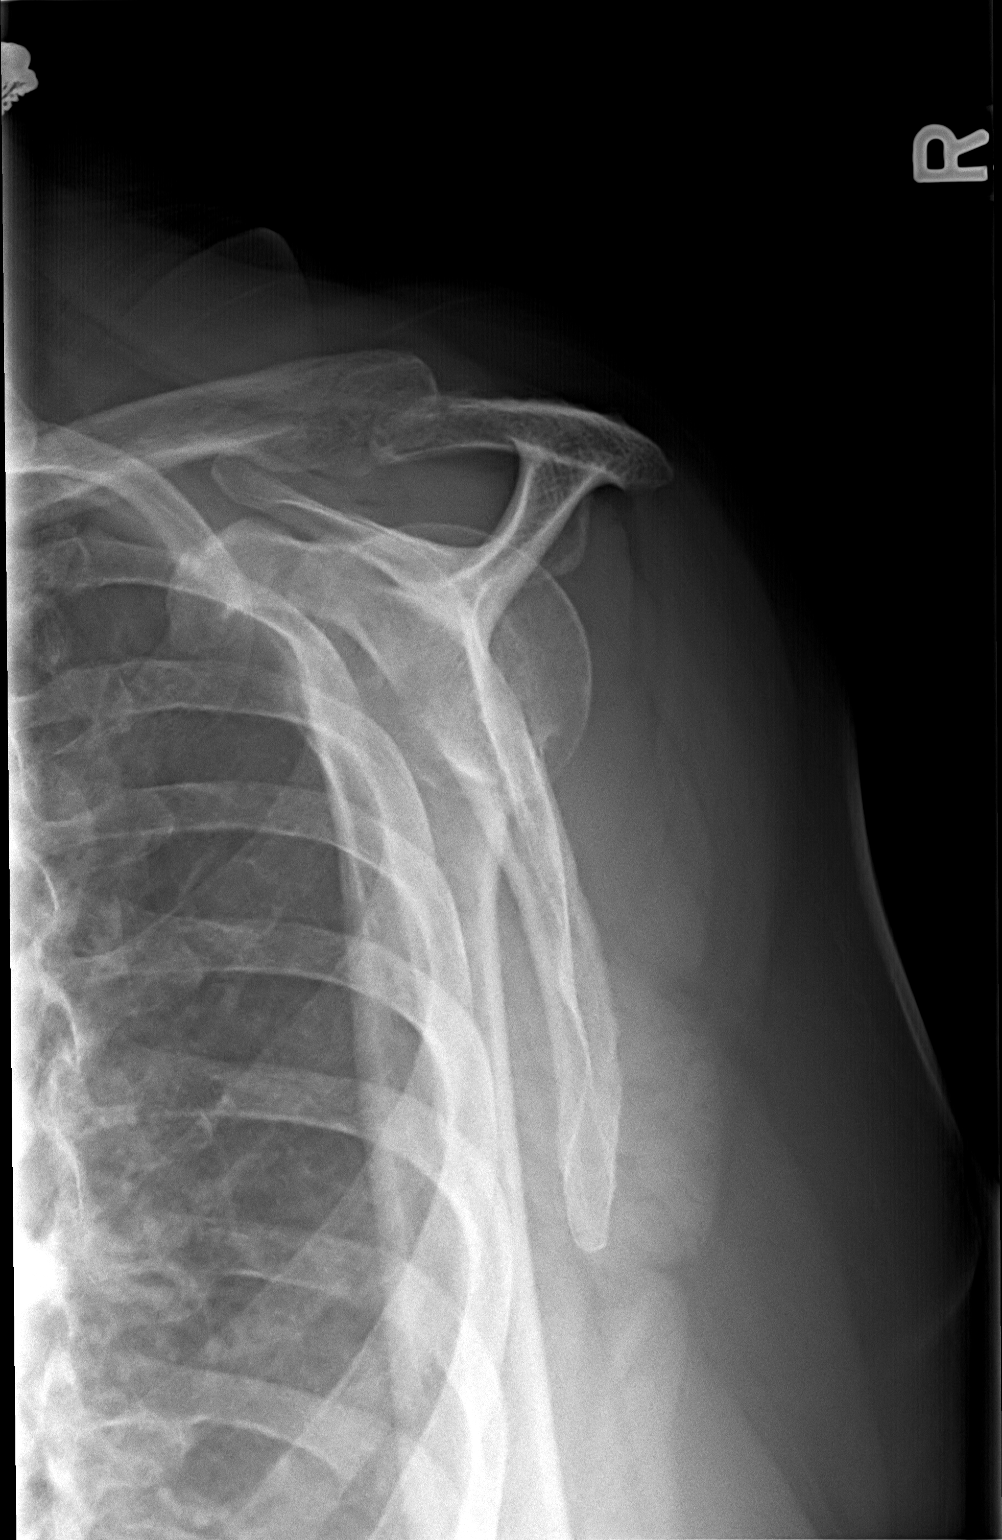

[3 of 3 positions shown; findings below may reference images not displayed]

FINDINGS: Osseous demineralization.
AC joint alignment normal.
No acute fracture, dislocation or bone destruction.
Visualized right ribs appear intact.
IMPRESSION: No acute osseous abnormalities.
Osseous demineralization.

## 2014-09-08 ENCOUNTER — Other Ambulatory Visit: Payer: Self-pay | Admitting: *Deleted

## 2014-09-08 MED ORDER — LEVOTHYROXINE SODIUM 125 MCG PO TABS: 125.0000 ug | ORAL_TABLET | Freq: Every day | ORAL | Status: DC

## 2015-07-02 ENCOUNTER — Ambulatory Visit (INDEPENDENT_AMBULATORY_CARE_PROVIDER_SITE_OTHER): Payer: Medicare PPO

## 2015-07-02 ENCOUNTER — Other Ambulatory Visit: Payer: Self-pay | Admitting: Nurse Practitioner

## 2015-07-02 ENCOUNTER — Ambulatory Visit (INDEPENDENT_AMBULATORY_CARE_PROVIDER_SITE_OTHER): Payer: Medicare PPO | Admitting: Nurse Practitioner

## 2015-07-02 ENCOUNTER — Encounter: Payer: Self-pay | Admitting: Nurse Practitioner

## 2015-07-02 VITALS — BP 144/75 | HR 89 | Temp 97.6°F | Ht 62.0 in | Wt 186.0 lb

## 2015-07-02 DIAGNOSIS — E785 Hyperlipidemia, unspecified: Secondary | ICD-10-CM

## 2015-07-02 DIAGNOSIS — Z780 Asymptomatic menopausal state: Secondary | ICD-10-CM | POA: Diagnosis not present

## 2015-07-02 DIAGNOSIS — M545 Low back pain, unspecified: Secondary | ICD-10-CM

## 2015-07-02 DIAGNOSIS — E039 Hypothyroidism, unspecified: Secondary | ICD-10-CM

## 2015-07-02 DIAGNOSIS — E559 Vitamin D deficiency, unspecified: Secondary | ICD-10-CM

## 2015-07-02 DIAGNOSIS — R0989 Other specified symptoms and signs involving the circulatory and respiratory systems: Secondary | ICD-10-CM | POA: Insufficient documentation

## 2015-07-02 MED ORDER — LEVOTHYROXINE SODIUM 125 MCG PO TABS: 125.0000 ug | ORAL_TABLET | Freq: Every day | ORAL | Status: DC

## 2015-07-02 MED ORDER — FENOFIBRATE 160 MG PO TABS: 160.0000 mg | ORAL_TABLET | Freq: Every day | ORAL | Status: DC

## 2015-07-02 MED ORDER — CYCLOBENZAPRINE HCL 5 MG PO TABS: 5.0000 mg | ORAL_TABLET | Freq: Three times a day (TID) | ORAL | Status: DC | PRN

## 2015-07-02 NOTE — Progress Notes (Signed)
Subjective:    Patient ID: Melinda Mitchell, female    DOB: [birth year], 69 y.o.   MRN: [id number]  Patient here today for follow up- doing well- no complaints today  Hyperlipidemia This is a chronic problem. The problem is controlled. Recent lipid tests were reviewed and are variable. She has no history of diabetes or hypothyroidism. Pertinent negatives include no myalgias or shortness of breath. Current antihyperlipidemic treatment includes statins. The current treatment provides moderate improvement of lipids. Compliance problems include adherence to diet and adherence to exercise.  Risk factors for coronary artery disease include dyslipidemia, obesity and post-menopausal.  Thyroid Problem Symptoms include menstrual problem. Patient reports no cold intolerance, diaphoresis, diarrhea, heat intolerance, palpitations or visual change. Her past medical history is significant for hyperlipidemia. There is no history of diabetes.  chronic low back pain Uses flexeril which helps  Review of Systems  Constitutional: Negative for diaphoresis.  Respiratory: Negative for shortness of breath.   Cardiovascular: Negative for palpitations.  Gastrointestinal: Negative for diarrhea.  Endocrine: Negative for cold intolerance and heat intolerance.  Genitourinary: Positive for menstrual problem.  Musculoskeletal: Negative for myalgias.  All other systems reviewed and are negative.      Objective:   Physical Exam  Constitutional: She is oriented to person, place, and time. She appears well-developed and well-nourished.  HENT:  Nose: Nose normal.  Mouth/Throat: Oropharynx is clear and moist.  Eyes: EOM are normal.  Neck: Trachea normal, normal range of motion and full passive range of motion without pain. Neck supple. No JVD present. Carotid bruit is not present. No thyromegaly present.  Cardiovascular: Normal rate, regular rhythm, normal heart sounds and intact distal pulses.  Exam reveals no gallop and no  friction rub.   No murmur heard. Right carotic bruit  Pulmonary/Chest: Effort normal. She has wheezes (expiratory throughout).  Abdominal: Soft. Bowel sounds are normal. She exhibits no distension and no mass. There is no tenderness.  Musculoskeletal: Normal range of motion.  Lymphadenopathy:    She has no cervical adenopathy.  Neurological: She is alert and oriented to person, place, and time. She has normal reflexes.  Skin: Skin is warm and dry.  Psychiatric: She has a normal mood and affect. Her behavior is normal. Judgment and thought content normal.    BP 144/75 mmHg  Pulse 89  Temp(Src) 97.6 F (36.4 C) (Oral)  Ht _0  (1.575 m)  Wt 186 lb (84.369 kg)  BMI 34.01 kg/m2  Chest x ray- no cardiopulmonary diseases-Preliminary reading by Ronnald Collum, FNP  [facility] [place]   EKG- Kerry Hough, FNP        Assessment & Plan:  1. Hypothyroidism (acquired) - Thyroid Panel With TSH - levothyroxine (SYNTHROID, LEVOTHROID) 125 MCG tablet; Take 1 tablet (125 mcg total) by mouth daily before breakfast.  Dispense: 90 tablet; Refill: 2  2. Hyperlipidemia Low fat diet - CMP14+EGFR - Lipid panel - DG Chest 2 View; Future - EKG 12-Lead  3. Vitamin D insufficiency  4. Morbid obesity Discussed diet and exercise for person with BMI >25 Will recheck weight in 3-6 months   5. Left-sided low back pain without sciatica - cyclobenzaprine (FLEXERIL) 5 MG tablet; Take 1 tablet (5 mg total) by mouth 3 (three) times daily as needed for muscle spasms.  Dispense: 30 tablet; Refill: 1  6. Bruit of right carotid artery - US Carotid Duplex Bilateral; Future    Labs pending Health maintenance reviewed Diet and exercise encouraged Continue all meds Follow up  In 3 month  Mary-Margaret Hassell Done, FNP

## 2015-07-03 ENCOUNTER — Other Ambulatory Visit: Payer: Self-pay | Admitting: Nurse Practitioner

## 2015-07-03 LAB — LIPID PANEL
CHOL/HDL RATIO: 5.9 ratio — AB (ref 0.0–4.4)
Cholesterol, Total: 259 mg/dL — ABNORMAL HIGH (ref 100–199)
HDL: 44 mg/dL (ref >39)
TRIGLYCERIDES: 518 mg/dL — AB (ref 0–149)

## 2015-07-03 LAB — CMP14+EGFR
ALK PHOS: 76 IU/L (ref 39–117)
ALT: 21 IU/L (ref 0–32)
AST: 20 IU/L (ref 0–40)
Albumin/Globulin Ratio: 1.8 (ref 1.1–2.5)
Albumin: 4.2 g/dL (ref 3.6–4.8)
BUN/Creatinine Ratio: 15 (ref 11–26)
BUN: 13 mg/dL (ref 8–27)
Bilirubin Total: 0.3 mg/dL (ref 0.0–1.2)
CO2: 20 mmol/L (ref 18–29)
CREATININE: 0.84 mg/dL (ref 0.57–1.00)
Calcium: 9.6 mg/dL (ref 8.7–10.3)
Chloride: 102 mmol/L (ref 97–108)
GFR calc Af Amer: 82 mL/min/{1.73_m2} (ref >59)
GFR calc non Af Amer: 71 mL/min/{1.73_m2} (ref >59)
Globulin, Total: 2.3 g/dL (ref 1.5–4.5)
Glucose: 97 mg/dL (ref 65–99)
Potassium: 4.5 mmol/L (ref 3.5–5.2)
SODIUM: 142 mmol/L (ref 134–144)
Total Protein: 6.5 g/dL (ref 6.0–8.5)

## 2015-07-03 LAB — THYROID PANEL WITH TSH
Free Thyroxine Index: 3.2 (ref 1.2–4.9)
T3 UPTAKE RATIO: 28 % (ref 24–39)
T4, Total: 11.6 ug/dL (ref 4.5–12.0)
TSH: 0.251 u[IU]/mL — AB (ref 0.450–4.500)

## 2015-07-03 MED ORDER — LEVOTHYROXINE SODIUM 112 MCG PO TABS: 112.0000 ug | ORAL_TABLET | Freq: Every day | ORAL | Status: DC

## 2015-07-10 ENCOUNTER — Ambulatory Visit (HOSPITAL_COMMUNITY)
Admission: RE | Admit: 2015-07-10 | Discharge: 2015-07-10 | Disposition: A | Discharge status: Home / Self Care | Payer: Medicare PPO | Source: Ambulatory Visit | Attending: Nurse Practitioner | Admitting: Nurse Practitioner

## 2015-07-10 ENCOUNTER — Other Ambulatory Visit: Payer: Self-pay | Admitting: Nurse Practitioner

## 2015-07-10 DIAGNOSIS — Z720 Tobacco use: Secondary | ICD-10-CM | POA: Diagnosis not present

## 2015-07-10 DIAGNOSIS — I6522 Occlusion and stenosis of left carotid artery: Secondary | ICD-10-CM

## 2015-07-10 DIAGNOSIS — R0989 Other specified symptoms and signs involving the circulatory and respiratory systems: Secondary | ICD-10-CM | POA: Insufficient documentation

## 2015-07-10 DIAGNOSIS — I2510 Atherosclerotic heart disease of native coronary artery without angina pectoris: Secondary | ICD-10-CM | POA: Diagnosis not present

## 2015-07-10 DIAGNOSIS — I6523 Occlusion and stenosis of bilateral carotid arteries: Secondary | ICD-10-CM | POA: Diagnosis not present

## 2015-08-13 DIAGNOSIS — Z23 Encounter for immunization: Secondary | ICD-10-CM | POA: Diagnosis not present

## 2015-09-21 ENCOUNTER — Telehealth: Payer: Self-pay | Admitting: Nurse Practitioner

## 2015-09-21 NOTE — Telephone Encounter (Signed)
SPOKE TO PT

## 2015-10-15 ENCOUNTER — Other Ambulatory Visit: Payer: Self-pay | Admitting: Nurse Practitioner

## 2015-11-10 ENCOUNTER — Telehealth: Payer: Self-pay | Admitting: Nurse Practitioner

## 2015-11-10 NOTE — Telephone Encounter (Signed)
Will call back to schedule.

## 2016-01-21 ENCOUNTER — Encounter: Payer: Self-pay | Admitting: Nurse Practitioner

## 2016-01-21 ENCOUNTER — Ambulatory Visit (INDEPENDENT_AMBULATORY_CARE_PROVIDER_SITE_OTHER): Payer: Medicare PPO | Admitting: Nurse Practitioner

## 2016-01-21 VITALS — BP 148/84 | HR 70 | Temp 97.1°F | Ht 62.0 in | Wt 181.0 lb

## 2016-01-21 DIAGNOSIS — Z1159 Encounter for screening for other viral diseases: Secondary | ICD-10-CM | POA: Diagnosis not present

## 2016-01-21 DIAGNOSIS — Z1212 Encounter for screening for malignant neoplasm of rectum: Secondary | ICD-10-CM

## 2016-01-21 DIAGNOSIS — I6523 Occlusion and stenosis of bilateral carotid arteries: Secondary | ICD-10-CM | POA: Diagnosis not present

## 2016-01-21 DIAGNOSIS — E785 Hyperlipidemia, unspecified: Secondary | ICD-10-CM

## 2016-01-21 DIAGNOSIS — E039 Hypothyroidism, unspecified: Secondary | ICD-10-CM

## 2016-01-21 MED ORDER — LEVOTHYROXINE SODIUM 112 MCG PO TABS: 112.0000 ug | ORAL_TABLET | Freq: Every day | ORAL | Status: DC

## 2016-01-21 NOTE — Progress Notes (Signed)
Subjective:    Patient ID: Melinda Mitchell, female    DOB: 1945-12-31, 70 y.o.   MRN: [id number]  Patient here today for follow up of chronic medical problems.  Outpatient Encounter Prescriptions as of 01/21/2016  Medication Sig  . acetaminophen (TYLENOL) 325 MG tablet Take 325 mg by mouth every 6 (six) hours as needed for pain.  Marland Kitchen aspirin 81 MG tablet Take 81 mg by mouth daily.  . cholecalciferol (VITAMIN D) 1000 UNITS tablet Take 2,000 Units by mouth daily.  . cyclobenzaprine (FLEXERIL) 5 MG tablet Take 1 tablet (5 mg total) by mouth 3 (three) times daily as needed for muscle spasms.  Javier Docker Oil 300 MG CAPS Take 600 mg by mouth daily.  Marland Kitchen levothyroxine (SYNTHROID, LEVOTHROID) 112 MCG tablet Take 1 tablet (112 mcg total) by mouth daily.  . Lutein 20 MG CAPS Take 6 mg by mouth daily.   . VENTOLIN HFA 108 (90 BASE) MCG/ACT inhaler USE 2 PUFFS BY MOUTH EVERY 6 HOURS AS NEEDED FOR WHEEZING OR SHORTNESS OF BREATH.  . vitamin B-12 (CYANOCOBALAMIN) 250 MCG tablet Take 2,500 mcg by mouth daily.    No facility-administered encounter medications on file as of 01/21/2016.     Hyperlipidemia This is a chronic problem. The problem is controlled. Recent lipid tests were reviewed and are variable. She has no history of diabetes or hypothyroidism. Pertinent negatives include no myalgias or shortness of breath. Current antihyperlipidemic treatment includes statins. The current treatment provides moderate improvement of lipids. Compliance problems include adherence to diet and adherence to exercise.  Risk factors for coronary artery disease include dyslipidemia, obesity and post-menopausal.  Thyroid Problem Symptoms include menstrual problem. Patient reports no cold intolerance, diaphoresis, diarrhea, heat intolerance, palpitations or visual change. Her past medical history is significant for hyperlipidemia. There is no history of diabetes.  chronic low back pain Uses flexeril which helps Carotid artery  stenosis Last carotid doppler was done on 07/10/15 which showed 70-90% stenosis on right and 50-69% on left. Has not seen specialist.    Review of Systems  Constitutional: Negative for diaphoresis.  Respiratory: Negative for shortness of breath.   Cardiovascular: Negative for palpitations.  Gastrointestinal: Negative for diarrhea.  Endocrine: Negative for cold intolerance and heat intolerance.  Genitourinary: Positive for menstrual problem.  Musculoskeletal: Negative for myalgias.  All other systems reviewed and are negative.      Objective:   Physical Exam  Constitutional: She is oriented to person, place, and time. She appears well-developed and well-nourished.  HENT:  Nose: Nose normal.  Mouth/Throat: Oropharynx is clear and moist.  Eyes: EOM are normal.  Neck: Trachea normal, normal range of motion and full passive range of motion without pain. Neck supple. No JVD present. Carotid bruit is not present. No thyromegaly present.  Cardiovascular: Normal rate, regular rhythm, normal heart sounds and intact distal pulses.  Exam reveals no gallop and no friction rub.   No murmur heard. Right carotic bruit  Pulmonary/Chest: Effort normal. She has wheezes (expiratory throughout).  Abdominal: Soft. Bowel sounds are normal. She exhibits no distension and no mass. There is no tenderness.  Musculoskeletal: Normal range of motion.  Lymphadenopathy:    She has no cervical adenopathy.  Neurological: She is alert and oriented to person, place, and time. She has normal reflexes.  Skin: Skin is warm and dry.  Psychiatric: She has a normal mood and affect. Her behavior is normal. Judgment and thought content normal.    BP 148/84 mmHg  Pulse 70  Temp(Src) 97.1 F (36.2 C) (Oral)  Ht _0  (1.575 m)  Wt 181 lb (82.101 kg)  BMI 33.10 kg/m2         Assessment & Plan:  1. Hypothyroidism (acquired) - Thyroid Panel With TSH - levothyroxine (SYNTHROID, LEVOTHROID) 112 MCG tablet; Take 1  tablet (112 mcg total) by mouth daily.  Dispense: 90 tablet; Refill: 1  2. Hyperlipidemia Low fat diet - CMP14+EGFR - Lipid panel  3. Morbid obesity, unspecified obesity type ([place]) Discussed diet and exercise for person with BMI >25 Will recheck weight in 3-6 months   4. Carotid artery stenosis, bilateral Patient does not want to see anyone at this point- we discussed the chances she is taking- she will let me know when ready to see someone  5. Screening for malignant neoplasm of the rectum - Fecal occult blood, imunochemical; Future  6. Need for hepatitis C screening test - Hepatitis C antibody    Labs pending Health maintenance reviewed Diet and exercise encouraged Continue all meds Follow up  In 3 months   [place], FNP

## 2016-01-21 NOTE — Patient Instructions (Signed)
°Carotid Artery Disease °The carotid arteries are the two main arteries on either side of the neck that supply blood to the brain. Carotid artery disease, also called carotid artery stenosis, is the narrowing or blockage of one or both carotid arteries. Carotid artery disease increases your risk for a stroke or a transient ischemic attack (TIA). A TIA is an episode in which a waxy, fatty substance that accumulates within the artery (plaque) blocks blood flow to the brain. A TIA is considered a "warning stroke."  °CAUSES  °· Buildup of plaque inside the carotid arteries (atherosclerosis) (common). °· A weakened outpouching in an artery (aneurysm). °· Inflammation of the carotid artery (arteritis). °· A fibrous growth within the carotid artery (fibromuscular dysplasia). °· Tissue death within the carotid artery due to radiation treatment (post-radiation necrosis). °· Decreased blood flow due to spasms of the carotid artery (vasospasm). °· Separation of the walls of the carotid artery (carotid dissection). °RISK FACTORS °· High cholesterol (dyslipidemia).   °· High blood pressure (hypertension).   °· Smoking.   °· Obesity.   °· Diabetes.   °· Family history of cardiovascular disease.   °· Inactivity or lack of regular exercise.   °· Being female. Men have an increased risk of developing atherosclerosis earlier in life than women.   °SYMPTOMS  °Carotid artery disease does not cause symptoms. °DIAGNOSIS °Diagnosis of carotid artery disease may include:  °· A physical exam. Your health care provider may hear an abnormal sound (bruit) when listening to the carotid arteries.   °· Specific tests that look at the blood flow in the carotid arteries. These tests include:   °¨ Carotid artery ultrasonography.   °¨ Carotid or cerebral angiography.   °¨ Computerized tomographic angiography (CTA).   °¨ Magnetic resonance angiography (MRA).   °TREATMENT  °Treatment of carotid artery disease can include a combination of treatments.  Treatment options include: °· Surgery. You may have:   °¨ A carotid endarterectomy. This is a surgery to remove the blockages in the carotid arteries.   °¨ A carotid angioplasty with stenting. This is a nonsurgical interventional procedure. A wire mesh (stent) is used to widen the blocked carotid arteries.   °· Medicines to control blood pressure, cholesterol, and reduce blood clotting (antiplatelet therapy).   °· Adjusting your diet.   °· Lifestyle changes such as:   °¨ Quitting smoking.   °¨ Exercising as tolerated or as directed by your health care provider.   °¨ Controlling and maintaining a good blood pressure.   °¨ Keeping cholesterol levels under control.   °HOME CARE INSTRUCTIONS  °· Take medicines only as directed by your health care provider. Make sure you understand all your medicine instructions. Do not stop your medicines without talking to your health care provider.   °· Follow your health care provider's diet instructions. It is important to eat a healthy diet that is low in saturated fats and includes plenty of fresh fruits, vegetables, and lean meats. High-fat, high-sodium foods as well as foods that are fried, overly processed, or have poor nutritional value should be avoided. °· Maintain a healthy weight.   °· Stay physically active. It is recommended that you get at least 30 minutes of activity every day.   °· Do not use any tobacco products including cigarettes, chewing tobacco, or electronic cigarettes. If you need help quitting, ask your health care provider. °· Limit alcohol use to:   °¨ No more than 2 drinks per day for men.   °¨ No more than 1 drink per day for nonpregnant women.   °· Do not use illegal drugs.   °· Keep all follow-up visits as directed by your health   care provider.   °SEEK IMMEDIATE MEDICAL CARE IF:  °You develop TIA or stroke symptoms. These include:  °· Sudden weakness or numbness on one side of the body, such as in the face, arm, or leg.   °· Sudden confusion.    °· Trouble speaking (aphasia) or understanding.   °· Sudden trouble seeing out of one or both eyes.   °· Sudden trouble walking.   °· Dizziness or feeling like you might faint.   °· Loss of balance or coordination.   °· Sudden severe headache with no known cause.   °· Sudden trouble swallowing (dysphagia).   °If you have any of these symptoms, call your local emergency services (911 in [place] Do not drive yourself to the clinic or hospital. This is a medical emergency.  °  °This information is not intended to replace advice given to you by your health care provider. Make sure you discuss any questions you have with your health care provider. °  °Document Released: 01/02/2012 Document Revised: 10/31/2014 Document Reviewed: 04/10/2013 °Elsevier Interactive Patient Education ©2016 [employer] ° °

## 2016-01-22 LAB — THYROID PANEL WITH TSH
FREE THYROXINE INDEX: 3.2 (ref 1.2–4.9)
T3 Uptake Ratio: 28 % (ref 24–39)
T4 TOTAL: 11.3 ug/dL (ref 4.5–12.0)
TSH: 1.06 u[IU]/mL (ref 0.450–4.500)

## 2016-01-22 LAB — LIPID PANEL
CHOLESTEROL TOTAL: 226 mg/dL — AB (ref 100–199)
Chol/HDL Ratio: 3.7 ratio units (ref 0.0–4.4)
HDL: 61 mg/dL (ref >39)
LDL Calculated: 125 mg/dL — ABNORMAL HIGH (ref 0–99)
Triglycerides: 201 mg/dL — ABNORMAL HIGH (ref 0–149)
VLDL Cholesterol Cal: 40 mg/dL (ref 5–40)

## 2016-01-22 LAB — CMP14+EGFR
ALBUMIN: 4.3 g/dL (ref 3.6–4.8)
ALK PHOS: 81 IU/L (ref 39–117)
ALT: 18 IU/L (ref 0–32)
AST: 18 IU/L (ref 0–40)
Albumin/Globulin Ratio: 1.7 (ref 1.2–2.2)
BILIRUBIN TOTAL: 0.3 mg/dL (ref 0.0–1.2)
BUN / CREAT RATIO: 12 (ref 11–26)
BUN: 10 mg/dL (ref 8–27)
CHLORIDE: 102 mmol/L (ref 96–106)
CO2: 19 mmol/L (ref 18–29)
Calcium: 9.7 mg/dL (ref 8.7–10.3)
Creatinine, Ser: 0.86 mg/dL (ref 0.57–1.00)
GFR calc Af Amer: 80 mL/min/{1.73_m2} (ref >59)
GFR calc non Af Amer: 69 mL/min/{1.73_m2} (ref >59)
GLOBULIN, TOTAL: 2.5 g/dL (ref 1.5–4.5)
GLUCOSE: 110 mg/dL — AB (ref 65–99)
Potassium: 4.4 mmol/L (ref 3.5–5.2)
SODIUM: 141 mmol/L (ref 134–144)
Total Protein: 6.8 g/dL (ref 6.0–8.5)

## 2016-01-22 LAB — HEPATITIS C ANTIBODY: Hep C Virus Ab: 0.1 s/co ratio (ref 0.0–0.9)

## 2016-03-29 IMAGING — US US CAROTID DUPLEX BILAT
1 series · 13 of 24 positions shown · non-contrast
Comparison: 09/08/2011

CLINICAL DATA: Right carotid bruit, coronary disease, tobacco abuse

EXAM:
BILATERAL CAROTID DUPLEX ULTRASOUND
TECHNIQUE: Gray scale imaging, color Doppler and duplex ultrasound was
performed of bilateral carotid and vertebral arteries in the neck.

[Series 1: us carotid duplex bilat · 0.06mm/px · 13 of 69 slices shown]
[im 1/69]
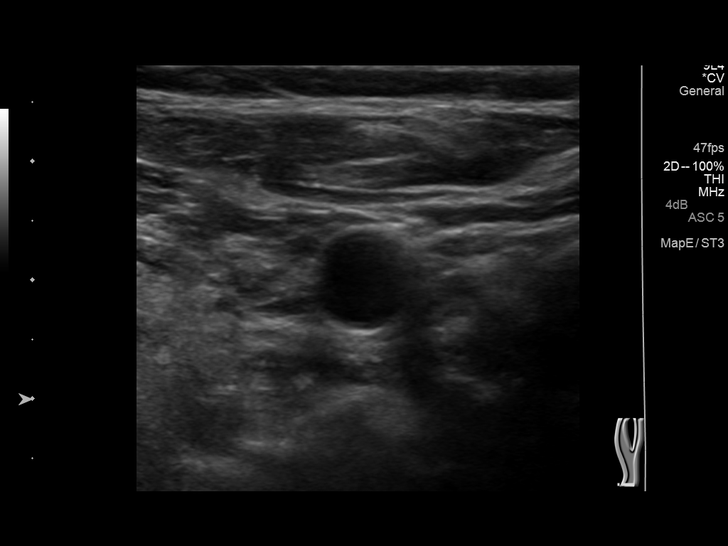
[im 6/69]
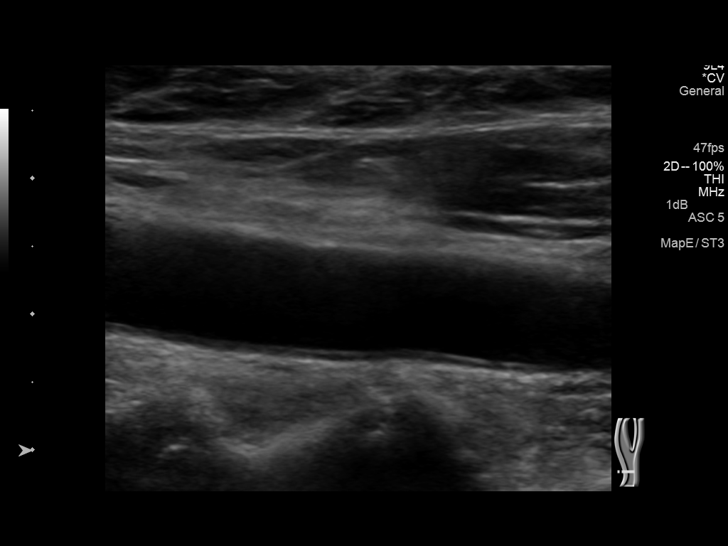
[im 12/69]
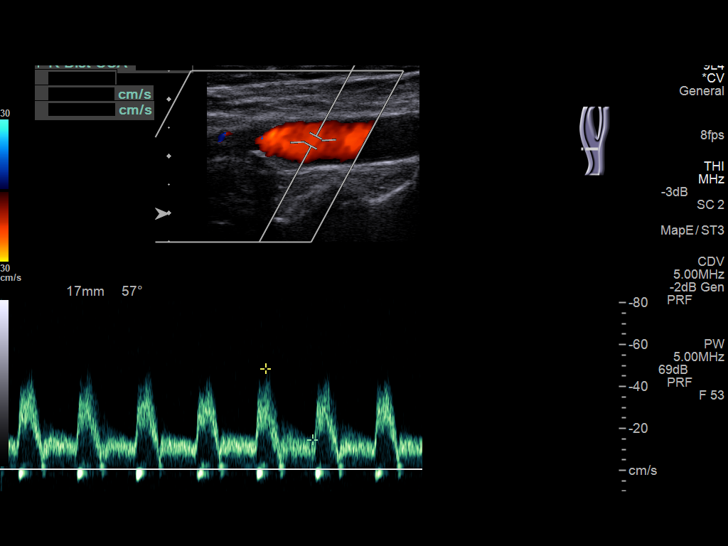
[im 18/69]
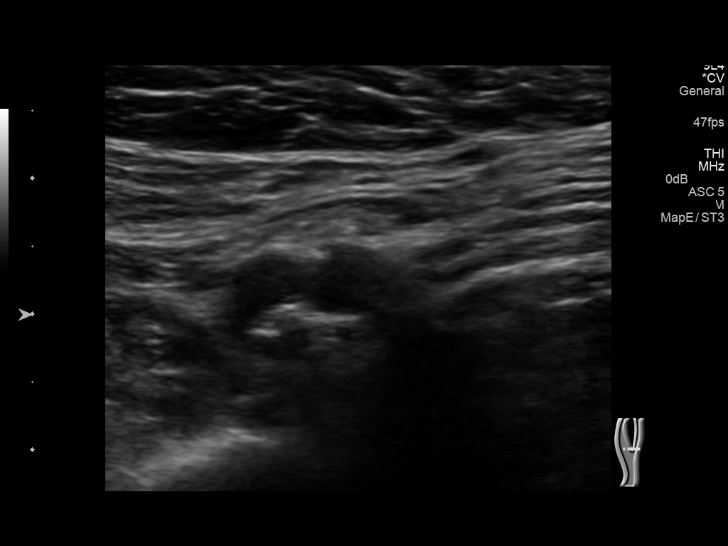
[im 24/69]
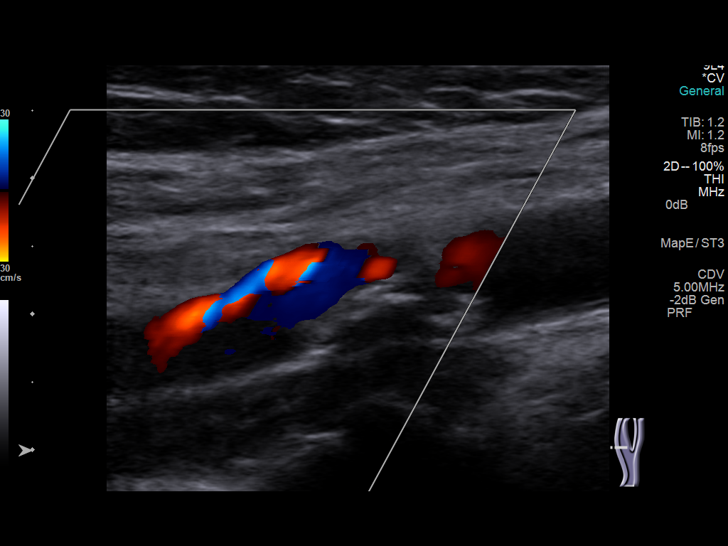
[im 30/69]
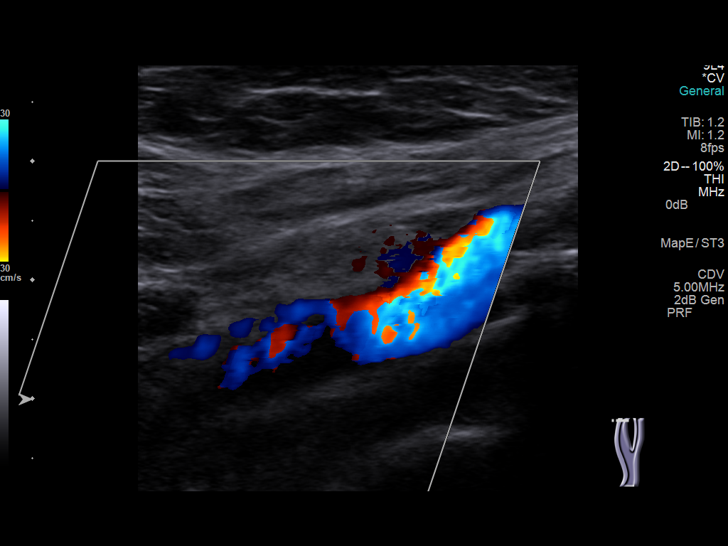
[im 36/69]
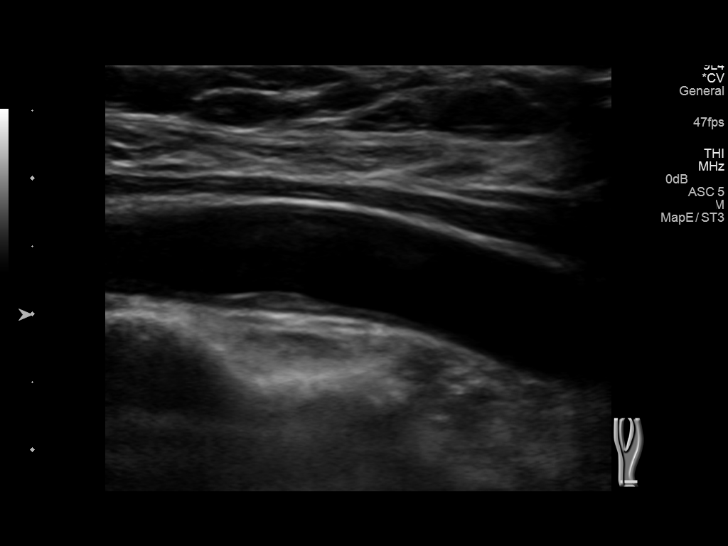
[im 39/69]
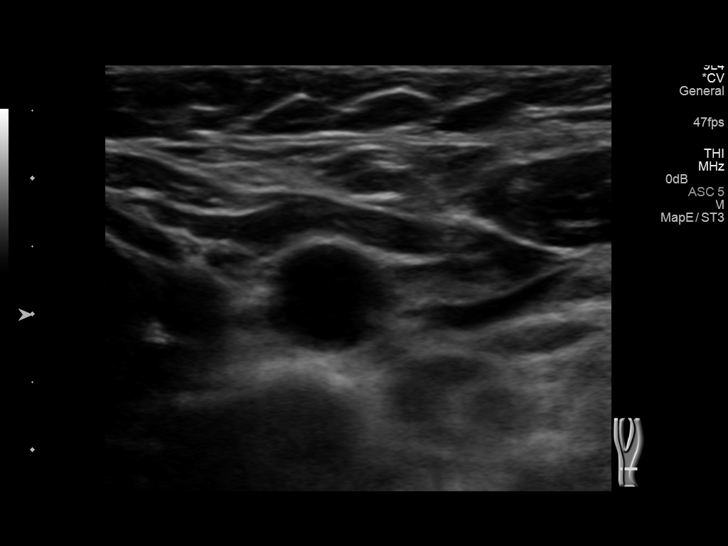
[im 45/69]
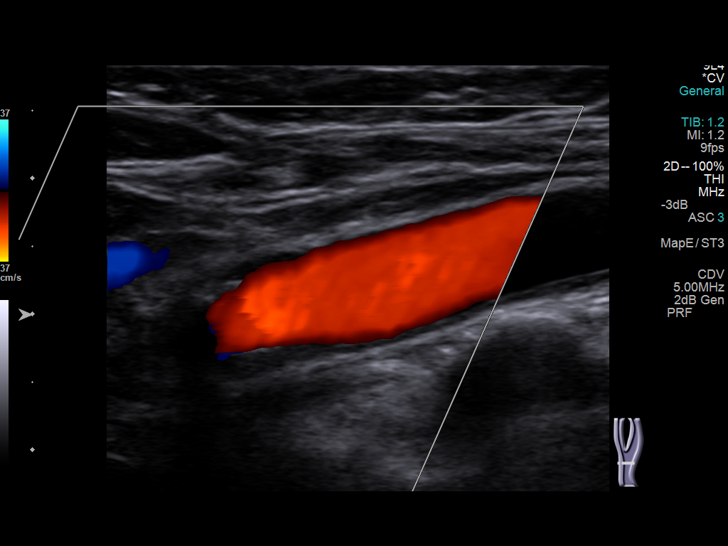
[im 51/69]
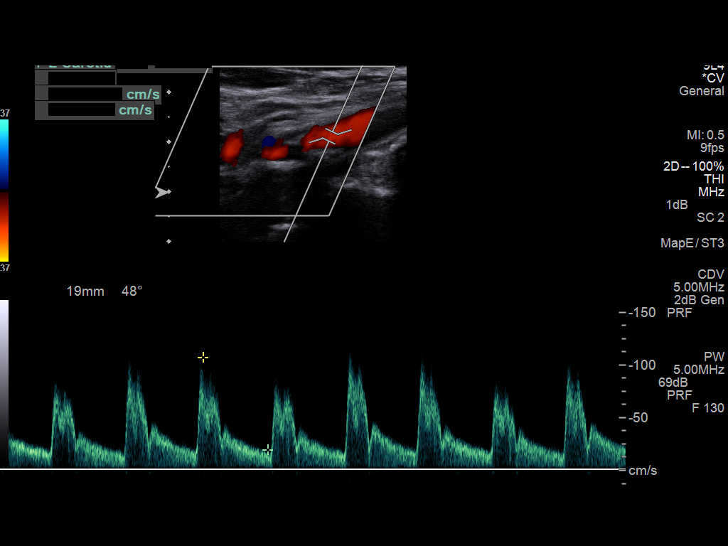
[im 57/69]
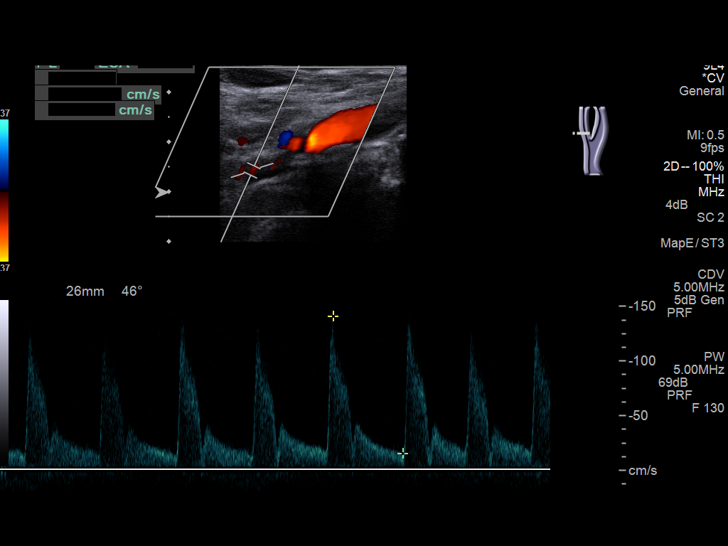
[im 63/69]
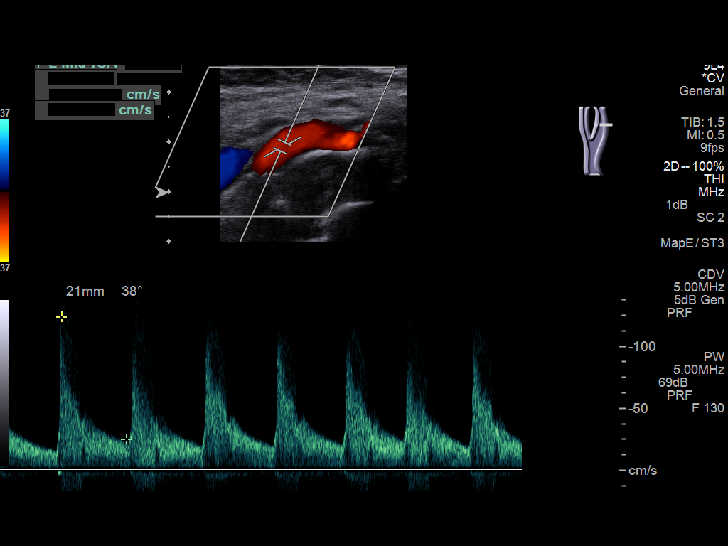
[im 69/69]
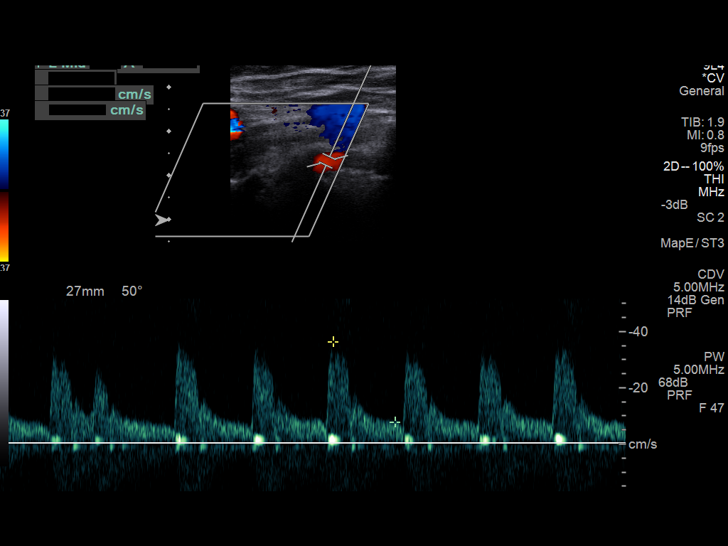

[13 of 24 positions shown; findings below may reference images not displayed]

REVIEW OF SYSTEMS:
Quantification of carotid stenosis is based on velocity parameters
that correlate the residual internal carotid diameter with
NASCET-based stenosis levels, using the diameter of the distal
internal carotid lumen as the denominator for stenosis measurement.

The following velocity measurements were obtained:

PEAK SYSTOLIC/END DIASTOLIC

RIGHT

ICA:                     379/102cm/sec

CCA:                     67/15cm/sec

SYSTOLIC ICA/CCA RATIO:

DIASTOLIC ICA/CCA RATIO:

ECA:                     334cm/sec

LEFT

ICA:                     201/59cm/sec

CCA:                     102/23cm/sec

SYSTOLIC ICA/CCA RATIO:

DIASTOLIC ICA/CCA RATIO:

ECA:                     141cm/sec
FINDINGS: RIGHT CAROTID ARTERY: Circumferential somewhat irregular plaque
effaces the bulb, extending into proximal internal and external
carotid arteries. Elevated peak systolic velocities in the proximal
ECA and ICA with some focal aliasing on color Doppler interrogation
just distal to the ICA plaque. Normal waveforms distally.

RIGHT VERTEBRAL ARTERY:  Normal flow direction and waveform.

LEFT CAROTID ARTERY: Circumferential somewhat irregular plaque in
the bulb into proximal ECA and ICA. Mildly elevated peak systolic
velocities just beyond the ICA plaque. Normal waveforms distally. No
focal aliasing is demonstrated.

LEFT VERTEBRAL ARTERY: Normal flow direction and waveform.
IMPRESSION: 1. Bilateral carotid bifurcation and proximal ICA plaque resulting
in 70-99% diameter stenosis on the right, 50-69% diameter stenosis
on the left. There has been some progression of disease on the left
since previous exam. Consider vascular surgical or
neurointerventional radiology consultation.

## 2016-04-25 ENCOUNTER — Encounter: Payer: Self-pay | Admitting: Nurse Practitioner

## 2016-04-25 ENCOUNTER — Ambulatory Visit (INDEPENDENT_AMBULATORY_CARE_PROVIDER_SITE_OTHER): Payer: Medicare PPO | Admitting: Nurse Practitioner

## 2016-04-25 VITALS — BP 158/84 | HR 70 | Temp 97.5°F | Ht 62.0 in | Wt 181.0 lb

## 2016-04-25 DIAGNOSIS — Z23 Encounter for immunization: Secondary | ICD-10-CM

## 2016-04-25 DIAGNOSIS — E785 Hyperlipidemia, unspecified: Secondary | ICD-10-CM

## 2016-04-25 DIAGNOSIS — E039 Hypothyroidism, unspecified: Secondary | ICD-10-CM | POA: Diagnosis not present

## 2016-04-25 DIAGNOSIS — I10 Essential (primary) hypertension: Secondary | ICD-10-CM

## 2016-04-25 DIAGNOSIS — I6523 Occlusion and stenosis of bilateral carotid arteries: Secondary | ICD-10-CM | POA: Diagnosis not present

## 2016-04-25 DIAGNOSIS — R0989 Other specified symptoms and signs involving the circulatory and respiratory systems: Secondary | ICD-10-CM

## 2016-04-25 MED ORDER — LEVOTHYROXINE SODIUM 112 MCG PO TABS: 112.0000 ug | ORAL_TABLET | Freq: Every day | ORAL | Status: DC

## 2016-04-25 NOTE — Patient Instructions (Signed)
Hypertension Hypertension, commonly called high blood pressure, is when the force of blood pumping through your arteries is too strong. Your arteries are the blood vessels that carry blood from your heart throughout your body. A blood pressure reading consists of a higher number over a lower number, such as 110/72. The higher number (systolic) is the pressure inside your arteries when your heart pumps. The lower number (diastolic) is the pressure inside your arteries when your heart relaxes. Ideally you want your blood pressure below 120/80. Hypertension forces your heart to work harder to pump blood. Your arteries may become narrow or stiff. Having untreated or uncontrolled hypertension can cause heart attack, stroke, kidney disease, and other problems. RISK FACTORS Some risk factors for high blood pressure are controllable. Others are not.  Risk factors you cannot control include:   Race. You may be at higher risk if you are African American.  Age. Risk increases with age.  Gender. Men are at higher risk than women before age 45 years. After age 65, women are at higher risk than men. Risk factors you can control include:  Not getting enough exercise or physical activity.  Being overweight.  Getting too much fat, sugar, calories, or salt in your diet.  Drinking too much alcohol. SIGNS AND SYMPTOMS Hypertension does not usually cause signs or symptoms. Extremely high blood pressure (hypertensive crisis) may cause headache, anxiety, shortness of breath, and nosebleed. DIAGNOSIS To check if you have hypertension, your health care provider will measure your blood pressure while you are seated, with your arm held at the level of your heart. It should be measured at least twice using the same arm. Certain conditions can cause a difference in blood pressure between your right and left arms. A blood pressure reading that is higher than normal on one occasion does not mean that you need treatment. If  it is not clear whether you have high blood pressure, you may be asked to return on a different day to have your blood pressure checked again. Or, you may be asked to monitor your blood pressure at home for 1 or more weeks. TREATMENT Treating high blood pressure includes making lifestyle changes and possibly taking medicine. Living a healthy lifestyle can help lower high blood pressure. You may need to change some of your habits. Lifestyle changes may include:  Following the DASH diet. This diet is high in fruits, vegetables, and whole grains. It is low in salt, red meat, and added sugars.  Keep your sodium intake below 2,300 mg per day.  Getting at least 30-45 minutes of aerobic exercise at least 4 times per week.  Losing weight if necessary.  Not smoking.  Limiting alcoholic beverages.  Learning ways to reduce stress. Your health care provider may prescribe medicine if lifestyle changes are not enough to get your blood pressure under control, and if one of the following is true:  You are 18-59 years of age and your systolic blood pressure is above 140.  You are 60 years of age or older, and your systolic blood pressure is above 150.  Your diastolic blood pressure is above 90.  You have diabetes, and your systolic blood pressure is over 140 or your diastolic blood pressure is over 90.  You have kidney disease and your blood pressure is above 140/90.  You have heart disease and your blood pressure is above 140/90. Your personal target blood pressure may vary depending on your medical conditions, your age, and other factors. HOME CARE INSTRUCTIONS    Have your blood pressure rechecked as directed by your health care provider.   Take medicines only as directed by your health care provider. Follow the directions carefully. Blood pressure medicines must be taken as prescribed. The medicine does not work as well when you skip doses. Skipping doses also puts you at risk for  problems.  Do not smoke.   Monitor your blood pressure at home as directed by your health care provider. SEEK MEDICAL CARE IF:   You think you are having a reaction to medicines taken.  You have recurrent headaches or feel dizzy.  You have swelling in your ankles.  You have trouble with your vision. SEEK IMMEDIATE MEDICAL CARE IF:  You develop a severe headache or confusion.  You have unusual weakness, numbness, or feel faint.  You have severe chest or abdominal pain.  You vomit repeatedly.  You have trouble breathing. MAKE SURE YOU:   Understand these instructions.  Will watch your condition.  Will get help right away if you are not doing well or get worse.   This information is not intended to replace advice given to you by your health care provider. Make sure you discuss any questions you have with your health care provider.   Document Released: 10/10/2005 Document Revised: 02/24/2015 Document Reviewed: 08/02/2013 Elsevier Interactive Patient Education 2016 [employer] Inc.  

## 2016-04-25 NOTE — Progress Notes (Signed)
Subjective:    Patient ID: Melinda Mitchell, female    DOB: Jan 30, 1946, 70 y.o.   MRN: [id number]  Patient here today for follow up of chronic medical problems.  Outpatient Encounter Prescriptions as of 04/25/2016  Medication Sig  . acetaminophen (TYLENOL) 325 MG tablet Take 325 mg by mouth every 6 (six) hours as needed for pain.  Marland Kitchen levothyroxine (SYNTHROID, LEVOTHROID) 112 MCG tablet Take 1 tablet (112 mcg total) by mouth daily.  . vitamin B-12 (CYANOCOBALAMIN) 250 MCG tablet Take 2,500 mcg by mouth daily.   . [DISCONTINUED] aspirin 81 MG tablet Take 81 mg by mouth daily. Reported on 01/21/2016  . [DISCONTINUED] Krill Oil 300 MG CAPS Take 600 mg by mouth daily.  . VENTOLIN HFA 108 (90 BASE) MCG/ACT inhaler USE 2 PUFFS BY MOUTH EVERY 6 HOURS AS NEEDED FOR WHEEZING OR SHORTNESS OF BREATH. (Patient not taking: Reported on 04/25/2016)   No facility-administered encounter medications on file as of 04/25/2016.     Hyperlipidemia This is a chronic problem. The problem is controlled. Recent lipid tests were reviewed and are variable. She has no history of diabetes or hypothyroidism. Pertinent negatives include no myalgias or shortness of breath. Current antihyperlipidemic treatment includes statins. The current treatment provides moderate improvement of lipids. Compliance problems include adherence to diet and adherence to exercise.  Risk factors for coronary artery disease include dyslipidemia, obesity and post-menopausal.  Thyroid Problem Symptoms include menstrual problem. Patient reports no cold intolerance, diaphoresis, diarrhea, heat intolerance, palpitations or visual change. Her past medical history is significant for hyperlipidemia. There is no history of diabetes.  chronic low back pain Uses flexeril which helps Carotid artery stenosis Last carotid doppler was done on 07/10/15 which showed 70-90% stenosis on right and 50-69% on left. Has not seen specialist.    Review of Systems  Constitutional:  Negative for diaphoresis.  Respiratory: Negative for shortness of breath.   Cardiovascular: Negative for palpitations.  Gastrointestinal: Negative for diarrhea.  Endocrine: Negative for cold intolerance and heat intolerance.  Genitourinary: Positive for menstrual problem.  Musculoskeletal: Negative for myalgias.  All other systems reviewed and are negative.      Objective:   Physical Exam  Constitutional: She is oriented to person, place, and time. She appears well-developed and well-nourished.  HENT:  Nose: Nose normal.  Mouth/Throat: Oropharynx is clear and moist.  Eyes: EOM are normal.  Neck: Trachea normal, normal range of motion and full passive range of motion without pain. Neck supple. No JVD present. Carotid bruit is not present. No thyromegaly present.  Cardiovascular: Normal rate, regular rhythm, normal heart sounds and intact distal pulses.  Exam reveals no gallop and no friction rub.   No murmur heard. Right carotic bruit  Pulmonary/Chest: Effort normal. She has wheezes (expiratory throughout).  Abdominal: Soft. Bowel sounds are normal. She exhibits no distension and no mass. There is no tenderness.  Musculoskeletal: Normal range of motion.  Lymphadenopathy:    She has no cervical adenopathy.  Neurological: She is alert and oriented to person, place, and time. She has normal reflexes.  Skin: Skin is warm and dry.  Psychiatric: She has a normal mood and affect. Her behavior is normal. Judgment and thought content normal.    BP 158/84 mmHg  Pulse 70  Temp(Src) 97.5 F (36.4 C) (Oral)  Ht 5' 2"  (1.575 m)  Wt 181 lb (82.101 kg)  BMI 33.10 kg/m2      Assessment & Plan:  1. Carotid artery stenosis, bilateral Currently monitoring  2.  Hypothyroidism (acquired) - levothyroxine (SYNTHROID, LEVOTHROID) 112 MCG tablet; Take 1 tablet (112 mcg total) by mouth daily.  Dispense: 90 tablet; Refill: 1 - Thyroid Panel With TSH  3. Hyperlipidemia Low fat diet- patient  refuses cholesterol meds - Lipid panel  4. Morbid obesity, unspecified obesity type ([place]) Discussed diet and exercise for person with BMI >25 Will recheck weight in 3-6 months  5. Bruit of right carotid artery  6. Essential hypertension Patient refuses medication to bring down Do not add slat to diet - CMP14+EGFR   Encouraged to do hemoccult cards Labs pending Health maintenance reviewed Diet and exercise encouraged Continue all meds Follow up  In 3 months   [place], FNP

## 2016-04-25 NOTE — Addendum Note (Signed)
Addended by: Cleda DaubUCKER, Auriel Kist G on: 04/25/2016 04:01 PM   Modules accepted: Orders

## 2016-04-26 LAB — CMP14+EGFR
A/G RATIO: 1.7 (ref 1.2–2.2)
ALK PHOS: 83 IU/L (ref 39–117)
ALT: 18 IU/L (ref 0–32)
AST: 24 IU/L (ref 0–40)
Albumin: 4.4 g/dL (ref 3.5–4.8)
BUN/Creatinine Ratio: 14 (ref 12–28)
BUN: 10 mg/dL (ref 8–27)
Bilirubin Total: 0.3 mg/dL (ref 0.0–1.2)
CALCIUM: 9.8 mg/dL (ref 8.7–10.3)
CO2: 23 mmol/L (ref 18–29)
CREATININE: 0.72 mg/dL (ref 0.57–1.00)
Chloride: 102 mmol/L (ref 96–106)
GFR calc Af Amer: 98 mL/min/{1.73_m2} (ref >59)
GFR, EST NON AFRICAN AMERICAN: 85 mL/min/{1.73_m2} (ref >59)
Globulin, Total: 2.6 g/dL (ref 1.5–4.5)
Glucose: 87 mg/dL (ref 65–99)
POTASSIUM: 5 mmol/L (ref 3.5–5.2)
Sodium: 142 mmol/L (ref 134–144)
Total Protein: 7 g/dL (ref 6.0–8.5)

## 2016-04-26 LAB — THYROID PANEL WITH TSH
FREE THYROXINE INDEX: 2.7 (ref 1.2–4.9)
T3 UPTAKE RATIO: 30 % (ref 24–39)
T4, Total: 9.1 ug/dL (ref 4.5–12.0)
TSH: 1.21 u[IU]/mL (ref 0.450–4.500)

## 2016-04-26 LAB — LIPID PANEL
CHOL/HDL RATIO: 5 ratio — AB (ref 0.0–4.4)
CHOLESTEROL TOTAL: 273 mg/dL — AB (ref 100–199)
HDL: 55 mg/dL (ref >39)
LDL Calculated: 143 mg/dL — ABNORMAL HIGH (ref 0–99)
TRIGLYCERIDES: 377 mg/dL — AB (ref 0–149)
VLDL Cholesterol Cal: 75 mg/dL — ABNORMAL HIGH (ref 5–40)

## 2016-07-25 DIAGNOSIS — Z23 Encounter for immunization: Secondary | ICD-10-CM | POA: Diagnosis not present

## 2016-09-19 ENCOUNTER — Ambulatory Visit (INDEPENDENT_AMBULATORY_CARE_PROVIDER_SITE_OTHER): Payer: Medicare PPO | Admitting: Nurse Practitioner

## 2016-09-19 ENCOUNTER — Encounter: Payer: Self-pay | Admitting: Nurse Practitioner

## 2016-09-19 VITALS — BP 148/72 | HR 72 | Temp 96.6°F | Ht 62.0 in | Wt 175.0 lb

## 2016-09-19 DIAGNOSIS — E559 Vitamin D deficiency, unspecified: Secondary | ICD-10-CM

## 2016-09-19 DIAGNOSIS — I10 Essential (primary) hypertension: Secondary | ICD-10-CM

## 2016-09-19 DIAGNOSIS — E782 Mixed hyperlipidemia: Secondary | ICD-10-CM | POA: Diagnosis not present

## 2016-09-19 DIAGNOSIS — E039 Hypothyroidism, unspecified: Secondary | ICD-10-CM | POA: Diagnosis not present

## 2016-09-19 DIAGNOSIS — I6523 Occlusion and stenosis of bilateral carotid arteries: Secondary | ICD-10-CM

## 2016-09-19 MED ORDER — LEVOTHYROXINE SODIUM 112 MCG PO TABS: 112.0000 ug | ORAL_TABLET | Freq: Every day | ORAL | 1 refills | Status: DC

## 2016-09-19 MED ORDER — LISINOPRIL 20 MG PO TABS: 20.0000 mg | ORAL_TABLET | Freq: Every day | ORAL | 3 refills | Status: DC

## 2016-09-19 NOTE — Addendum Note (Signed)
Addended by: Bennie PieriniMARTIN, MARY-MARGARET on: 09/19/2016 12:13 PM   Modules accepted: Orders

## 2016-09-19 NOTE — Progress Notes (Addendum)
Subjective:    Patient ID: Melinda Mitchell, female    DOB: Sep 15, 1946, 70 y.o.   MRN: [id number]  Patient here today for follow up of chronic medical problems.  Outpatient Encounter Prescriptions as of 09/19/2016  Medication Sig  . acetaminophen (TYLENOL) 325 MG tablet Take 325 mg by mouth every 6 (six) hours as needed for pain.  Marland Kitchen levothyroxine (SYNTHROID, LEVOTHROID) 112 MCG tablet Take 1 tablet (112 mcg total) by mouth daily.  . Misc Natural Products (LUTEIN 20 PO) Take by mouth.  . vitamin B-12 (CYANOCOBALAMIN) 250 MCG tablet Take 2,500 mcg by mouth daily.   . VENTOLIN HFA 108 (90 BASE) MCG/ACT inhaler USE 2 PUFFS BY MOUTH EVERY 6 HOURS AS NEEDED FOR WHEEZING OR SHORTNESS OF BREATH. (Patient not taking: Reported on 09/19/2016)   No facility-administered encounter medications on file as of 09/19/2016.      Hyperlipidemia  This is a chronic problem. The problem is controlled. Recent lipid tests were reviewed and are variable. She has no history of diabetes or hypothyroidism. Pertinent negatives include no myalgias or shortness of breath. Current antihyperlipidemic treatment includes statins. The current treatment provides moderate improvement of lipids. Compliance problems include adherence to diet and adherence to exercise.  Risk factors for coronary artery disease include dyslipidemia, obesity and post-menopausal.  Thyroid Problem  Symptoms include menstrual problem. Patient reports no cold intolerance, diaphoresis, diarrhea, heat intolerance, palpitations or visual change. Her past medical history is significant for hyperlipidemia. There is no history of diabetes.  Hypertension  This is a new problem. The current episode started more than 1 month ago. The problem is unchanged (blood pressure reading sat home have been as high as 240'X systolic but cinsistently above 735'H systolic.). Pertinent negatives include no palpitations or shortness of breath. Risk factors for coronary artery disease  include diabetes mellitus, dyslipidemia, obesity, post-menopausal state and sedentary lifestyle. Compliance problems include diet and exercise.  Hypertensive end-organ damage includes CAD/MI and a thyroid problem. There is no history of kidney disease.  chronic low back pain Uses flexeril which helps Carotid artery stenosis Last carotid doppler was done on 07/10/15 which showed 70-90% stenosis on right and 50-69% on left. Has not seen specialist. On 09/14/16 she had an episode where she had some chest pain and shortness of breath that last about 1/2 hour b then went away- has not had any since then.    Review of Systems  Constitutional: Negative for diaphoresis.  Respiratory: Negative for shortness of breath.   Cardiovascular: Negative for palpitations.  Gastrointestinal: Negative for diarrhea.  Endocrine: Negative for cold intolerance and heat intolerance.  Genitourinary: Positive for menstrual problem.  Musculoskeletal: Negative for myalgias.  All other systems reviewed and are negative.      Objective:   Physical Exam  Constitutional: She is oriented to person, place, and time. She appears well-developed and well-nourished.  HENT:  Nose: Nose normal.  Mouth/Throat: Oropharynx is clear and moist.  Eyes: EOM are normal.  Neck: Trachea normal, normal range of motion and full passive range of motion without pain. Neck supple. No JVD present. Carotid bruit is not present. No thyromegaly present.  Cardiovascular: Normal rate, regular rhythm, normal heart sounds and intact distal pulses.  Exam reveals no gallop and no friction rub.   No murmur heard. Right carotic bruit  Pulmonary/Chest: Effort normal. She has wheezes (expiratory throughout).  Abdominal: Soft. Bowel sounds are normal. She exhibits no distension and no mass. There is no tenderness.  Musculoskeletal: Normal range of motion.  Lymphadenopathy:    She has no cervical adenopathy.  Neurological: She is alert and oriented to  person, place, and time. She has normal reflexes.  Skin: Skin is warm and dry.  Psychiatric: She has a normal mood and affect. Her behavior is normal. Judgment and thought content normal.   BP (!) 148/72 (BP Location: Left Arm, Cuff Size: Large)   Pulse 72   Temp (!) 96.6 F (35.9 C) (Oral)   Ht 5' 2"  (1.575 m)   Wt 175 lb (79.4 kg)   BMI 32.01 kg/m   EKG- Atrial fib- new Precious Reel, FNP       Assessment & Plan:  1. Bilateral carotid artery stenosis  2. Hypothyroidism (acquired) - levothyroxine (SYNTHROID, LEVOTHROID) 112 MCG tablet; Take 1 tablet (112 mcg total) by mouth daily.  Dispense: 90 tablet; Refill: 1 - Thyroid Panel With TSH  3. Mixed hyperlipidemia Low fat diet  4. Morbid obesity ([place]) Discussed diet and exercise for person with BMI >25 Will recheck weight in 3-6 months  5. Vitamin D insufficiency  6. Essential hypertension Low sodium diet Added lisinopril to meds- side effects reviewed - lisinopril (PRINIVIL,ZESTRIL) 20 MG tablet; Take 1 tablet (20 mg total) by mouth daily.  Dispense: 90 tablet; Refill: 3 - CMP14+EGFR - Lipid panel  7. Atrial fib Daily aspirin 357m Reviewed valsalva manuver  Labs pending Health maintenance reviewed Diet and exercise encouraged Continue all meds Follow up  In 3 months   [place] FNP

## 2016-09-19 NOTE — Patient Instructions (Signed)
Hypertension Hypertension is another name for high blood pressure. High blood pressure forces your heart to work harder to pump blood. A blood pressure reading has two numbers, which includes a higher number over a lower number (example: 110/72). Follow these instructions at home:  Have your blood pressure rechecked by your doctor.  Only take medicine as told by your doctor. Follow the directions carefully. The medicine does not work as well if you skip doses. Skipping doses also puts you at risk for problems.  Do not smoke.  Monitor your blood pressure at home as told by your doctor. Contact a doctor if:  You think you are having a reaction to the medicine you are taking.  You have repeat headaches or feel dizzy.  You have puffiness (swelling) in your ankles.  You have trouble with your vision. Get help right away if:  You get a very bad headache and are confused.  You feel weak, numb, or faint.  You get chest or belly (abdominal) pain.  You throw up (vomit).  You cannot breathe very well. This information is not intended to replace advice given to you by your health care provider. Make sure you discuss any questions you have with your health care provider. Document Released: 03/28/2008 Document Revised: 03/17/2016 Document Reviewed: 08/02/2013 Elsevier Interactive Patient Education  2017 Elsevier Inc.  

## 2016-09-20 LAB — THYROID PANEL WITH TSH
FREE THYROXINE INDEX: 3 (ref 1.2–4.9)
T3 UPTAKE RATIO: 30 % (ref 24–39)
T4, Total: 10 ug/dL (ref 4.5–12.0)
TSH: 1.05 u[IU]/mL (ref 0.450–4.500)

## 2016-09-20 LAB — CMP14+EGFR
A/G RATIO: 1.6 (ref 1.2–2.2)
ALK PHOS: 80 IU/L (ref 39–117)
ALT: 16 IU/L (ref 0–32)
AST: 18 IU/L (ref 0–40)
Albumin: 4.4 g/dL (ref 3.5–4.8)
BILIRUBIN TOTAL: 0.5 mg/dL (ref 0.0–1.2)
BUN/Creatinine Ratio: 17 (ref 12–28)
BUN: 14 mg/dL (ref 8–27)
CHLORIDE: 102 mmol/L (ref 96–106)
CO2: 23 mmol/L (ref 18–29)
Calcium: 9.6 mg/dL (ref 8.7–10.3)
Creatinine, Ser: 0.84 mg/dL (ref 0.57–1.00)
GFR calc Af Amer: 81 mL/min/{1.73_m2} (ref >59)
GFR, EST NON AFRICAN AMERICAN: 71 mL/min/{1.73_m2} (ref >59)
GLOBULIN, TOTAL: 2.7 g/dL (ref 1.5–4.5)
Glucose: 106 mg/dL — ABNORMAL HIGH (ref 65–99)
POTASSIUM: 4.5 mmol/L (ref 3.5–5.2)
SODIUM: 142 mmol/L (ref 134–144)
Total Protein: 7.1 g/dL (ref 6.0–8.5)

## 2016-09-20 LAB — LIPID PANEL
CHOL/HDL RATIO: 4.6 ratio — AB (ref 0.0–4.4)
CHOLESTEROL TOTAL: 261 mg/dL — AB (ref 100–199)
HDL: 57 mg/dL (ref >39)
LDL Calculated: 147 mg/dL — ABNORMAL HIGH (ref 0–99)
TRIGLYCERIDES: 286 mg/dL — AB (ref 0–149)
VLDL Cholesterol Cal: 57 mg/dL — ABNORMAL HIGH (ref 5–40)

## 2016-12-23 ENCOUNTER — Other Ambulatory Visit: Payer: Self-pay | Admitting: Nurse Practitioner

## 2016-12-23 DIAGNOSIS — I10 Essential (primary) hypertension: Secondary | ICD-10-CM

## 2017-06-30 ENCOUNTER — Other Ambulatory Visit: Payer: Self-pay | Admitting: Nurse Practitioner

## 2017-06-30 DIAGNOSIS — E039 Hypothyroidism, unspecified: Secondary | ICD-10-CM

## 2017-07-04 ENCOUNTER — Other Ambulatory Visit: Payer: Self-pay | Admitting: Nurse Practitioner

## 2017-07-04 DIAGNOSIS — E039 Hypothyroidism, unspecified: Secondary | ICD-10-CM

## 2017-09-28 ENCOUNTER — Encounter: Payer: Self-pay | Admitting: Nurse Practitioner

## 2017-09-28 ENCOUNTER — Ambulatory Visit: Payer: Medicare PPO | Admitting: Nurse Practitioner

## 2017-09-28 VITALS — BP 148/76 | HR 80 | Temp 97.3°F | Ht 62.0 in | Wt 180.0 lb

## 2017-09-28 DIAGNOSIS — I482 Chronic atrial fibrillation, unspecified: Secondary | ICD-10-CM

## 2017-09-28 DIAGNOSIS — E039 Hypothyroidism, unspecified: Secondary | ICD-10-CM | POA: Diagnosis not present

## 2017-09-28 DIAGNOSIS — I10 Essential (primary) hypertension: Secondary | ICD-10-CM | POA: Diagnosis not present

## 2017-09-28 DIAGNOSIS — I6523 Occlusion and stenosis of bilateral carotid arteries: Secondary | ICD-10-CM | POA: Diagnosis not present

## 2017-09-28 DIAGNOSIS — E782 Mixed hyperlipidemia: Secondary | ICD-10-CM

## 2017-09-28 DIAGNOSIS — E559 Vitamin D deficiency, unspecified: Secondary | ICD-10-CM | POA: Diagnosis not present

## 2017-09-28 MED ORDER — ALBUTEROL SULFATE HFA 108 (90 BASE) MCG/ACT IN AERS: INHALATION_SPRAY | RESPIRATORY_TRACT | 1 refills | Status: DC

## 2017-09-28 MED ORDER — METOPROLOL SUCCINATE ER 25 MG PO TB24: 25.0000 mg | ORAL_TABLET | Freq: Every day | ORAL | 3 refills | Status: DC

## 2017-09-28 MED ORDER — CYCLOBENZAPRINE HCL 5 MG PO TABS: ORAL_TABLET | ORAL | 1 refills | Status: DC

## 2017-09-28 MED ORDER — LEVOTHYROXINE SODIUM 112 MCG PO TABS: ORAL_TABLET | ORAL | 0 refills | Status: DC

## 2017-09-28 NOTE — Progress Notes (Signed)
Subjective:    Patient ID: Melinda Mitchell, female    DOB: Feb 27, 1946, 71 y.o.   MRN: [id number]  [place] is here today for follow up of chronic medical problem.  Outpatient Encounter Medications as of 09/28/2017  Medication Sig  . acetaminophen (TYLENOL) 325 MG tablet Take 325 mg by mouth every 6 (six) hours as needed for pain.  . cyclobenzaprine (FLEXERIL) 5 MG tablet TAKE 1 TABLET THREE TIMES DAILY AS NEEDED FOR MUSCLE SPASMS.  Marland Kitchen levothyroxine (SYNTHROID, LEVOTHROID) 112 MCG tablet TAKE (1) TABLET BY MOUTH DAILY  . lisinopril (PRINIVIL,ZESTRIL) 20 MG tablet TAKE (1) TABLET BY MOUTH DAILY  . Misc Natural Products (LUTEIN 20 PO) Take by mouth.  . VENTOLIN HFA 108 (90 BASE) MCG/ACT inhaler USE 2 PUFFS BY MOUTH EVERY 6 HOURS AS NEEDED FOR WHEEZING OR SHORTNESS OF BREATH. (Patient not taking: Reported on 09/19/2016)  . vitamin B-12 (CYANOCOBALAMIN) 250 MCG tablet Take 2,500 mcg by mouth daily.      1. Hypothyroidism (acquired)  Not having any problems that she is awareof  2. Mixed hyperlipidemia  Does not watch diet. She refuses to take a statin  3. Vitamin D insufficiency  Does not take any vitamin d supplements  4. Morbid obesity ([place])  No weight changes  5. Bilateral carotid artery stenosis  Has not had checked since 2016. Denies any dizziness  6.      Hypertension          Patient use  To be on lisinopril over a year ago. Started making her blood                     pressure go below 100systolic and pulse going above 100. So she stopped                 taking it all together.  Blood pressures at home have been 120 in mornings but            150's in evenings. 7.      Atrial fib           Low ventricular response. Patient says that heart rate will go up with activity.   New complaints: None today  Social history: Lives with her partner   Review of Systems  Constitutional: Negative for activity change and appetite change.  HENT: Negative.   Eyes: Negative for pain.    Respiratory: Negative for shortness of breath.   Cardiovascular: Negative for chest pain, palpitations and leg swelling.  Gastrointestinal: Negative for abdominal pain.  Endocrine: Negative for polydipsia.  Genitourinary: Negative.   Skin: Negative for rash.  Neurological: Negative for dizziness, weakness and headaches.  Hematological: Does not bruise/bleed easily.  Psychiatric/Behavioral: Negative.   All other systems reviewed and are negative.      Objective:   Physical Exam  Constitutional: She is oriented to person, place, and time. She appears well-developed and well-nourished.  HENT:  Nose: Nose normal.  Mouth/Throat: Oropharynx is clear and moist.  Eyes: EOM are normal.  Neck: Trachea normal, normal range of motion and full passive range of motion without pain. Neck supple. No JVD present. Carotid bruit is not present. No thyromegaly present.  Cardiovascular: Normal rate, regular rhythm, normal heart sounds and intact distal pulses. Exam reveals no gallop and no friction rub.  No murmur heard. Pulmonary/Chest: Effort normal and breath sounds normal.  Abdominal: Soft. Bowel sounds are normal. She exhibits no distension and no mass. There is no  tenderness.  Musculoskeletal: Normal range of motion.  Lymphadenopathy:    She has no cervical adenopathy.  Neurological: She is alert and oriented to person, place, and time. She has normal reflexes.  Skin: Skin is warm and dry.  Psychiatric: She has a normal mood and affect. Her behavior is normal. Judgment and thought content normal.   BP (!) 148/76   Pulse 80   Temp (!) 97.3 F (36.3 C) (Oral)   Ht 5' 2" (1.575 m)   Wt 180 lb (81.6 kg)   BMI 32.92 kg/m       Assessment & Plan:  1. Hypothyroidism (acquired) No prolems - Thyroid Panel With TSH  2. Mixed hyperlipidemia Low fat diet - Lipid panel  3. Vitamin D insufficiency - VITAMIN D 25 Hydroxy (Vit-D Deficiency, Fractures)  4. Morbid obesity ([place]) Discussed  diet and exercise for person with BMI >25 Will recheck weight in 3-6 months  5. Bilateral carotid artery stenosis Wants to wait until after march to have doppler study  6. Hypertension, unspecified type Low sodium diet - CMP14+EGFR  7. Chronic atrial fibrillation ([facility]) Avoid caffeine Added metoprolol - metoprolol succinate (TOPROL-XL) 25 MG 24 hr tablet; Take 1 tablet (25 mg total) by mouth daily.  Dispense: 90 tablet; Refill: 3    Labs pending Health maintenance reviewed Diet and exercise encouraged Continue all meds Follow up  In 3 months   [place], FNP

## 2017-09-28 NOTE — Patient Instructions (Signed)

## 2017-09-28 NOTE — Addendum Note (Signed)
Addended by: Bennie PieriniMARTIN, MARY-MARGARET on: 09/28/2017 03:48 PM   Modules accepted: Orders

## 2017-09-28 NOTE — Addendum Note (Signed)
Addended by: Bennie PieriniMARTIN, MARY-MARGARET on: 09/28/2017 04:16 PM   Modules accepted: Orders

## 2017-09-29 LAB — CBC WITH DIFFERENTIAL/PLATELET
BASOS ABS: 0 10*3/uL (ref 0.0–0.2)
BASOS: 1 %
EOS (ABSOLUTE): 0.1 10*3/uL (ref 0.0–0.4)
Eos: 1 %
Hematocrit: 46.6 % (ref 34.0–46.6)
Hemoglobin: 16.2 g/dL — ABNORMAL HIGH (ref 11.1–15.9)
Immature Grans (Abs): 0 10*3/uL (ref 0.0–0.1)
Immature Granulocytes: 0 %
Lymphocytes Absolute: 2.5 10*3/uL (ref 0.7–3.1)
Lymphs: 29 %
MCH: 31.5 pg (ref 26.6–33.0)
MCHC: 34.8 g/dL (ref 31.5–35.7)
MCV: 91 fL (ref 79–97)
MONOS ABS: 0.4 10*3/uL (ref 0.1–0.9)
Monocytes: 5 %
NEUTROS ABS: 5.6 10*3/uL (ref 1.4–7.0)
NEUTROS PCT: 64 %
PLATELETS: 310 10*3/uL (ref 150–379)
RBC: 5.15 x10E6/uL (ref 3.77–5.28)
RDW: 14 % (ref 12.3–15.4)
WBC: 8.7 10*3/uL (ref 3.4–10.8)

## 2017-09-29 LAB — CMP14+EGFR
A/G RATIO: 1.8 (ref 1.2–2.2)
ALK PHOS: 80 IU/L (ref 39–117)
ALT: 14 IU/L (ref 0–32)
AST: 16 IU/L (ref 0–40)
Albumin: 4.4 g/dL (ref 3.5–4.8)
BILIRUBIN TOTAL: 0.2 mg/dL (ref 0.0–1.2)
BUN / CREAT RATIO: 17 (ref 12–28)
BUN: 14 mg/dL (ref 8–27)
CHLORIDE: 107 mmol/L — AB (ref 96–106)
CO2: 21 mmol/L (ref 20–29)
Calcium: 9.6 mg/dL (ref 8.7–10.3)
Creatinine, Ser: 0.82 mg/dL (ref 0.57–1.00)
GFR calc Af Amer: 83 mL/min/{1.73_m2} (ref >59)
GFR calc non Af Amer: 72 mL/min/{1.73_m2} (ref >59)
GLUCOSE: 94 mg/dL (ref 65–99)
Globulin, Total: 2.4 g/dL (ref 1.5–4.5)
POTASSIUM: 4.3 mmol/L (ref 3.5–5.2)
Sodium: 143 mmol/L (ref 134–144)
Total Protein: 6.8 g/dL (ref 6.0–8.5)

## 2017-09-29 LAB — THYROID PANEL WITH TSH
FREE THYROXINE INDEX: 2.6 (ref 1.2–4.9)
T3 UPTAKE RATIO: 28 % (ref 24–39)
T4 TOTAL: 9.2 ug/dL (ref 4.5–12.0)
TSH: 0.678 u[IU]/mL (ref 0.450–4.500)

## 2017-09-29 LAB — LIPID PANEL
CHOLESTEROL TOTAL: 244 mg/dL — AB (ref 100–199)
Chol/HDL Ratio: 5 ratio — ABNORMAL HIGH (ref 0.0–4.4)
HDL: 49 mg/dL (ref >39)
Triglycerides: 463 mg/dL — ABNORMAL HIGH (ref 0–149)

## 2017-09-29 LAB — VITAMIN D 25 HYDROXY (VIT D DEFICIENCY, FRACTURES): VIT D 25 HYDROXY: 27.5 ng/mL — AB (ref 30.0–100.0)

## 2017-12-26 ENCOUNTER — Other Ambulatory Visit: Payer: Self-pay | Admitting: Nurse Practitioner

## 2017-12-26 DIAGNOSIS — E039 Hypothyroidism, unspecified: Secondary | ICD-10-CM

## 2018-08-13 DIAGNOSIS — Z23 Encounter for immunization: Secondary | ICD-10-CM | POA: Diagnosis not present

## 2018-09-18 ENCOUNTER — Telehealth: Payer: Self-pay | Admitting: Nurse Practitioner

## 2018-09-18 ENCOUNTER — Encounter: Payer: Self-pay | Admitting: Physician Assistant

## 2018-09-18 ENCOUNTER — Ambulatory Visit: Payer: Medicare PPO | Admitting: Physician Assistant

## 2018-09-18 VITALS — BP 162/75 | HR 75 | Temp 97.4°F | Ht 62.0 in | Wt 163.0 lb

## 2018-09-18 DIAGNOSIS — I10 Essential (primary) hypertension: Secondary | ICD-10-CM

## 2018-09-18 DIAGNOSIS — E782 Mixed hyperlipidemia: Secondary | ICD-10-CM | POA: Diagnosis not present

## 2018-09-18 DIAGNOSIS — R300 Dysuria: Secondary | ICD-10-CM

## 2018-09-18 DIAGNOSIS — N3001 Acute cystitis with hematuria: Secondary | ICD-10-CM | POA: Diagnosis not present

## 2018-09-18 DIAGNOSIS — E039 Hypothyroidism, unspecified: Secondary | ICD-10-CM | POA: Diagnosis not present

## 2018-09-18 LAB — URINALYSIS, COMPLETE
BILIRUBIN UA: NEGATIVE
Glucose, UA: NEGATIVE
KETONES UA: NEGATIVE
Nitrite, UA: NEGATIVE
PROTEIN UA: NEGATIVE
SPEC GRAV UA: 1.01 (ref 1.005–1.030)
Urobilinogen, Ur: 0.2 mg/dL (ref 0.2–1.0)
pH, UA: 5 (ref 5.0–7.5)

## 2018-09-18 LAB — MICROSCOPIC EXAMINATION

## 2018-09-18 MED ORDER — NITROFURANTOIN MONOHYD MACRO 100 MG PO CAPS: 100.0000 mg | ORAL_CAPSULE | Freq: Two times a day (BID) | ORAL | 0 refills | Status: DC

## 2018-09-18 NOTE — Telephone Encounter (Signed)
Apt made for today 

## 2018-09-18 NOTE — Progress Notes (Signed)
BP (!) 162/75   Pulse 75   Temp (!) 97.4 F (36.3 C) (Oral)   Ht 5' 2"  (1.575 m)   Wt 163 lb (73.9 kg)   BMI 29.81 kg/m    Subjective:    Patient ID: Melinda Mitchell, female    DOB: 17-Dec-1945, 72 y.o.   MRN: [id number]  HPI: Melinda Mitchell is a 72 y.o. female presenting on 09/18/2018 for Urine brown (started today , no pain) and Urinary Frequency  This patient has had several days of dysuria, frequency and nocturia. There is also pain over the bladder in the suprapubic region, no back pain. Denies leakage or hematuria.  Denies fever or chills. No pain in flank area.  Labs to be performed today for her maintenance meds. Would like her flexeril, levothyroxine and toprol XL refilled after.  Past Medical History:  Diagnosis Date  . Bilateral carotid artery stenosis   . Dyslipidemia   . Hypothyroidism   . Vitamin B12 deficiency    Relevant past medical, surgical, family and social history reviewed and updated as indicated. Interim medical history since our last visit reviewed. Allergies and medications reviewed and updated. DATA REVIEWED: CHART IN EPIC  Family History reviewed for pertinent findings.  Review of Systems  Constitutional: Negative.   HENT: Negative.   Eyes: Negative.   Respiratory: Negative.   Gastrointestinal: Negative.   Genitourinary: Negative.   Musculoskeletal: Positive for arthralgias, gait problem, joint swelling and myalgias.    Allergies as of 09/18/2018      Reactions   Cephalosporins    Keflex [cephalexin] Itching   Mobic [meloxicam]    Nsaids    States that NSAID's affected kidney function and caused increased potassium   Statins    myalgias   Penicillins Hives, Itching, Rash      Medication List        Accurate as of 09/18/18  2:01 PM. Always use your most recent med list.          acetaminophen 325 MG tablet Commonly known as:  TYLENOL Take 325 mg by mouth every 6 (six) hours as needed for pain.   aspirin 325 MG tablet Take 325 mg  by mouth as needed.   cyclobenzaprine 5 MG tablet Commonly known as:  FLEXERIL TAKE 1 TABLET THREE TIMES DAILY AS NEEDED FOR MUSCLE SPASMS.   ibuprofen 200 MG tablet Commonly known as:  ADVIL,MOTRIN Take 200 mg by mouth every 6 (six) hours as needed.   levothyroxine 112 MCG tablet Commonly known as:  SYNTHROID, LEVOTHROID TAKE 1 TABLET BY MOUTH DAILY   nitrofurantoin (macrocrystal-monohydrate) 100 MG capsule Commonly known as:  MACROBID Take 1 capsule (100 mg total) by mouth 2 (two) times daily. 1 po BId          Objective:    BP (!) 162/75   Pulse 75   Temp (!) 97.4 F (36.3 C) (Oral)   Ht 5' 2"  (1.575 m)   Wt 163 lb (73.9 kg)   BMI 29.81 kg/m   Allergies  Allergen Reactions  . Cephalosporins   . Keflex [Cephalexin] Itching  . Mobic [Meloxicam]   . Nsaids     States that NSAID's affected kidney function and caused increased potassium  . Statins     myalgias  . Penicillins Hives, Itching and Rash    Wt Readings from Last 3 Encounters:  09/18/18 163 lb (73.9 kg)  09/28/17 180 lb (81.6 kg)  09/19/16 175 lb (79.4 kg)    Physical Exam  Constitutional: She is oriented to person, place, and time. She appears well-developed and well-nourished.  HENT:  Head: Normocephalic and atraumatic.  Eyes: Pupils are equal, round, and reactive to light. Conjunctivae and EOM are normal.  Cardiovascular: Normal rate, regular rhythm, normal heart sounds and intact distal pulses.  Pulmonary/Chest: Effort normal and breath sounds normal.  Abdominal: Soft. Bowel sounds are normal. There is tenderness. There is guarding.  Neurological: She is alert and oriented to person, place, and time. She has normal reflexes.  Skin: Skin is warm and dry. No rash noted.  Psychiatric: She has a normal mood and affect. Her behavior is normal. Judgment and thought content normal.    Results for orders placed or performed in visit on 09/28/17  CMP14+EGFR  Result Value Ref Range   Glucose 94 65 -  99 mg/dL   BUN 14 8 - 27 mg/dL   Creatinine, Ser 0.82 0.57 - 1.00 mg/dL   GFR calc non Af Amer 72 >59 mL/min/1.73   GFR calc Af Amer 83 >59 mL/min/1.73   BUN/Creatinine Ratio 17 12 - 28   Sodium 143 134 - 144 mmol/L   Potassium 4.3 3.5 - 5.2 mmol/L   Chloride 107 (H) 96 - 106 mmol/L   CO2 21 20 - 29 mmol/L   Calcium 9.6 8.7 - 10.3 mg/dL   Total Protein 6.8 6.0 - 8.5 g/dL   Albumin 4.4 3.5 - 4.8 g/dL   Globulin, Total 2.4 1.5 - 4.5 g/dL   Albumin/Globulin Ratio 1.8 1.2 - 2.2   Bilirubin Total 0.2 0.0 - 1.2 mg/dL   Alkaline Phosphatase 80 39 - 117 IU/L   AST 16 0 - 40 IU/L   ALT 14 0 - 32 IU/L  Lipid panel  Result Value Ref Range   Cholesterol, Total 244 (H) 100 - 199 mg/dL   Triglycerides 463 (H) 0 - 149 mg/dL   HDL 49 >39 mg/dL   VLDL Cholesterol Cal Comment 5 - 40 mg/dL   LDL Calculated Comment 0 - 99 mg/dL   Chol/HDL Ratio 5.0 (H) 0.0 - 4.4 ratio  Thyroid Panel With TSH  Result Value Ref Range   TSH 0.678 0.450 - 4.500 uIU/mL   T4, Total 9.2 4.5 - 12.0 ug/dL   T3 Uptake Ratio 28 24 - 39 %   Free Thyroxine Index 2.6 1.2 - 4.9  VITAMIN D 25 Hydroxy (Vit-D Deficiency, Fractures)  Result Value Ref Range   Vit D, 25-Hydroxy 27.5 (L) 30.0 - 100.0 ng/mL  CBC with Differential/Platelet  Result Value Ref Range   WBC 8.7 3.4 - 10.8 x10E3/uL   RBC 5.15 3.77 - 5.28 x10E6/uL   Hemoglobin 16.2 (H) 11.1 - 15.9 g/dL   Hematocrit 46.6 34.0 - 46.6 %   MCV 91 79 - 97 fL   MCH 31.5 26.6 - 33.0 pg   MCHC 34.8 31.5 - 35.7 g/dL   RDW 14.0 12.3 - 15.4 %   Platelets 310 150 - 379 x10E3/uL   Neutrophils 64 Not Estab. %   Lymphs 29 Not Estab. %   Monocytes 5 Not Estab. %   Eos 1 Not Estab. %   Basos 1 Not Estab. %   Neutrophils Absolute 5.6 1.4 - 7.0 x10E3/uL   Lymphocytes Absolute 2.5 0.7 - 3.1 x10E3/uL   Monocytes Absolute 0.4 0.1 - 0.9 x10E3/uL   EOS (ABSOLUTE) 0.1 0.0 - 0.4 x10E3/uL   Basophils Absolute 0.0 0.0 - 0.2 x10E3/uL   Immature Granulocytes 0 Not Estab. %  Immature  Grans (Abs) 0.0 0.0 - 0.1 x10E3/uL      Assessment & Plan:   1. Dysuria - Urine Culture - Urinalysis, Complete  2. Acute cystitis with hematuria - nitrofurantoin, macrocrystal-monohydrate, (MACROBID) 100 MG capsule; Take 1 capsule (100 mg total) by mouth 2 (two) times daily. 1 po BId  Dispense: 14 capsule; Refill: 0  3. Hypertension, unspecified type - CBC with Differential/Platelet - CMP14+EGFR  4. Hypothyroidism (acquired) - TSH  5. Mixed hyperlipidemia - Lipid panel   Continue all other maintenance medications as listed above.  Follow up plan: No follow-ups on file.  Educational handout given for [place] PA-C [place] [address]  [place],  [address] [phone]   09/18/2018, 2:01 PM

## 2018-09-19 LAB — CBC WITH DIFFERENTIAL/PLATELET
BASOS: 1 %
Basophils Absolute: 0.1 10*3/uL (ref 0.0–0.2)
EOS (ABSOLUTE): 0.1 10*3/uL (ref 0.0–0.4)
Eos: 1 %
HEMATOCRIT: 44.8 % (ref 34.0–46.6)
HEMOGLOBIN: 15.7 g/dL (ref 11.1–15.9)
IMMATURE GRANS (ABS): 0 10*3/uL (ref 0.0–0.1)
Immature Granulocytes: 0 %
LYMPHS: 17 %
Lymphocytes Absolute: 1.6 10*3/uL (ref 0.7–3.1)
MCH: 32.1 pg (ref 26.6–33.0)
MCHC: 35 g/dL (ref 31.5–35.7)
MCV: 92 fL (ref 79–97)
MONOCYTES: 4 %
Monocytes Absolute: 0.4 10*3/uL (ref 0.1–0.9)
NEUTROS PCT: 77 %
Neutrophils Absolute: 7.5 10*3/uL — ABNORMAL HIGH (ref 1.4–7.0)
Platelets: 270 10*3/uL (ref 150–450)
RBC: 4.89 x10E6/uL (ref 3.77–5.28)
RDW: 12.9 % (ref 12.3–15.4)
WBC: 9.6 10*3/uL (ref 3.4–10.8)

## 2018-09-19 LAB — LIPID PANEL
CHOL/HDL RATIO: 3.8 ratio (ref 0.0–4.4)
CHOLESTEROL TOTAL: 230 mg/dL — AB (ref 100–199)
HDL: 60 mg/dL (ref >39)
LDL CALC: 138 mg/dL — AB (ref 0–99)
Triglycerides: 162 mg/dL — ABNORMAL HIGH (ref 0–149)
VLDL Cholesterol Cal: 32 mg/dL (ref 5–40)

## 2018-09-19 LAB — CMP14+EGFR
A/G RATIO: 1.7 (ref 1.2–2.2)
ALT: 11 IU/L (ref 0–32)
AST: 14 IU/L (ref 0–40)
Albumin: 4.2 g/dL (ref 3.5–4.8)
Alkaline Phosphatase: 81 IU/L (ref 39–117)
BUN/Creatinine Ratio: 15 (ref 12–28)
BUN: 13 mg/dL (ref 8–27)
Bilirubin Total: 0.5 mg/dL (ref 0.0–1.2)
CO2: 18 mmol/L — AB (ref 20–29)
CREATININE: 0.89 mg/dL (ref 0.57–1.00)
Calcium: 9.4 mg/dL (ref 8.7–10.3)
Chloride: 102 mmol/L (ref 96–106)
GFR, EST AFRICAN AMERICAN: 75 mL/min/{1.73_m2} (ref >59)
GFR, EST NON AFRICAN AMERICAN: 65 mL/min/{1.73_m2} (ref >59)
GLOBULIN, TOTAL: 2.5 g/dL (ref 1.5–4.5)
Glucose: 107 mg/dL — ABNORMAL HIGH (ref 65–99)
POTASSIUM: 3.9 mmol/L (ref 3.5–5.2)
SODIUM: 138 mmol/L (ref 134–144)
TOTAL PROTEIN: 6.7 g/dL (ref 6.0–8.5)

## 2018-09-19 LAB — TSH: TSH: 0.614 u[IU]/mL (ref 0.450–4.500)

## 2018-09-20 LAB — URINE CULTURE

## 2018-09-23 ENCOUNTER — Other Ambulatory Visit: Payer: Self-pay | Admitting: Physician Assistant

## 2018-09-23 DIAGNOSIS — E039 Hypothyroidism, unspecified: Secondary | ICD-10-CM

## 2018-09-23 MED ORDER — LEVOTHYROXINE SODIUM 112 MCG PO TABS: ORAL_TABLET | ORAL | 3 refills | Status: DC

## 2018-10-09 ENCOUNTER — Encounter: Payer: Self-pay | Admitting: Nurse Practitioner

## 2018-10-09 ENCOUNTER — Ambulatory Visit: Payer: Medicare PPO | Admitting: Nurse Practitioner

## 2018-10-09 VITALS — BP 143/71 | HR 68 | Temp 97.1°F | Ht 62.0 in | Wt 163.0 lb

## 2018-10-09 DIAGNOSIS — E782 Mixed hyperlipidemia: Secondary | ICD-10-CM

## 2018-10-09 DIAGNOSIS — E039 Hypothyroidism, unspecified: Secondary | ICD-10-CM | POA: Diagnosis not present

## 2018-10-09 DIAGNOSIS — I6523 Occlusion and stenosis of bilateral carotid arteries: Secondary | ICD-10-CM | POA: Diagnosis not present

## 2018-10-09 DIAGNOSIS — Z0001 Encounter for general adult medical examination with abnormal findings: Secondary | ICD-10-CM

## 2018-10-09 DIAGNOSIS — Z87440 Personal history of urinary (tract) infections: Secondary | ICD-10-CM

## 2018-10-09 DIAGNOSIS — E559 Vitamin D deficiency, unspecified: Secondary | ICD-10-CM

## 2018-10-09 DIAGNOSIS — N3000 Acute cystitis without hematuria: Secondary | ICD-10-CM

## 2018-10-09 DIAGNOSIS — Z0000 Encounter for general adult medical examination without abnormal findings: Secondary | ICD-10-CM

## 2018-10-09 LAB — URINALYSIS, COMPLETE
BILIRUBIN UA: NEGATIVE
GLUCOSE, UA: NEGATIVE
Ketones, UA: NEGATIVE
Nitrite, UA: NEGATIVE
PROTEIN UA: NEGATIVE
RBC, UA: NEGATIVE
Specific Gravity, UA: >1.03 — ABNORMAL HIGH (ref 1.005–1.030)
UUROB: 0.2 mg/dL (ref 0.2–1.0)
pH, UA: 5 (ref 5.0–7.5)

## 2018-10-09 LAB — MICROSCOPIC EXAMINATION: Renal Epithel, UA: NONE SEEN /hpf

## 2018-10-09 MED ORDER — CYCLOBENZAPRINE HCL 5 MG PO TABS: ORAL_TABLET | ORAL | 2 refills | Status: DC

## 2018-10-09 MED ORDER — LEVOTHYROXINE SODIUM 112 MCG PO TABS: ORAL_TABLET | ORAL | 3 refills | Status: DC

## 2018-10-09 MED ORDER — DOXYCYCLINE HYCLATE 100 MG PO TABS: 100.0000 mg | ORAL_TABLET | Freq: Two times a day (BID) | ORAL | 0 refills | Status: DC

## 2018-10-09 NOTE — Progress Notes (Signed)
Subjective:    Patient ID: Melinda Mitchell, female    DOB: 04-05-1946, 72 y.o.   MRN: [id number]   Chief Complaint: Annual Exam (Recheck UTI  No pap. Wants nystatin cream or powder for under breasts and legs)   HPI:  1. Recent urinary tract infection  She took all of her antibiotic and she has been trying to drink a lot of water. Which helps.  2. Hypothyroidism (acquired)  Nothing bothering her  3. Vitamin D insufficiency  Takes daily vitamin d supplement  4. Morbid obesity ([place])  Has last 12lbs since last visit.  5. Mixed hyperlipidemia  Has been watching her diet  6. Bilateral carotid artery stenosis  Had stenosis on left- she never went o see vascular surgeon. Sh edoes not want to do anything right now. She refuses to even repeat doppler study.    Outpatient Encounter Medications as of 10/09/2018  Medication Sig  . acetaminophen (TYLENOL) 325 MG tablet Take 325 mg by mouth every 6 (six) hours as needed for pain.  Marland Kitchen aspirin 325 MG tablet Take 325 mg by mouth as needed.   . cyclobenzaprine (FLEXERIL) 5 MG tablet TAKE 1 TABLET THREE TIMES DAILY AS NEEDED FOR MUSCLE SPASMS.  Marland Kitchen ibuprofen (ADVIL,MOTRIN) 200 MG tablet Take 200 mg by mouth every 6 (six) hours as needed.  Marland Kitchen levothyroxine (SYNTHROID, LEVOTHROID) 112 MCG tablet TAKE 1 TABLET BY MOUTH DAILY    New complaints: None today  Social history: Lives alone  Review of Systems  Constitutional: Negative for activity change and appetite change.  HENT: Negative.   Eyes: Negative for pain.  Respiratory: Negative for shortness of breath.   Cardiovascular: Negative for chest pain, palpitations and leg swelling.  Gastrointestinal: Negative for abdominal pain.  Endocrine: Negative for polydipsia.  Genitourinary: Negative.   Skin: Negative for rash.  Neurological: Negative for dizziness, weakness and headaches.  Hematological: Does not bruise/bleed easily.  Psychiatric/Behavioral: Negative.   All other systems reviewed and are  negative.      Objective:   Physical Exam Vitals signs and nursing note reviewed.  Constitutional:      General: She is not in acute distress.    Appearance: Normal appearance. She is well-developed.  HENT:     Head: Normocephalic.     Nose: Nose normal.  Eyes:     Pupils: Pupils are equal, round, and reactive to light.  Neck:     Musculoskeletal: Normal range of motion and neck supple.     Vascular: No carotid bruit or JVD.  Cardiovascular:     Rate and Rhythm: Normal rate and regular rhythm.     Heart sounds: Normal heart sounds.  Pulmonary:     Effort: Pulmonary effort is normal. No respiratory distress.     Breath sounds: Normal breath sounds. No wheezing or rales.  Chest:     Chest wall: No tenderness.  Abdominal:     General: Bowel sounds are normal. There is no distension or abdominal bruit.     Palpations: Abdomen is soft. There is no hepatomegaly, splenomegaly, mass or pulsatile mass.     Tenderness: There is no abdominal tenderness.  Musculoskeletal: Normal range of motion.  Lymphadenopathy:     Cervical: No cervical adenopathy.  Skin:    General: Skin is warm and dry.  Neurological:     Mental Status: She is alert and oriented to person, place, and time.     Deep Tendon Reflexes: Reflexes are normal and symmetric.  Psychiatric:  Behavior: Behavior normal.        Thought Content: Thought content normal.        Judgment: Judgment normal.    BP (!) 143/71   Pulse 68   Temp (!) 97.1 F (36.2 C) (Oral)   Ht _0  (1.575 m)   Wt 163 lb (73.9 kg)   BMI 29.81 kg/m   UA- 1+    Assessment & Plan:  Melinda Mitchell comes in today with chief complaint of Annual Exam (Recheck UTI  No pap. Wants nystatin cream or powder for under breasts and legs)   Diagnosis and orders addressed:  1. Annual physical exam  2. Recent urinary tract infection - Urinalysis, Complete  3. Hypothyroidism (acquired) - levothyroxine (SYNTHROID, LEVOTHROID) 112 MCG tablet; TAKE  1 TABLET BY MOUTH DAILY  Dispense: 90 tablet; Refill: 3 - Thyroid Panel With TSH  4. Vitamin D insufficiency Continue vitamind supplement  5. Morbid obesity ([place]) Discussed diet and exercise for person with BMI >25 Will recheck weight in 3-6 months  6. Mixed hyperlipidemia Low fta diet - CMP14+EGFR - Lipid panel  7. Bilateral carotid artery stenosis Refuses doppler study right now  8. Acute cystitis without hematuria Take medication as prescribe Cotton underwear Take shower not bath Cranberry juice, yogurt Force fluids AZO over the counter X2 days Culture pending RTO prn  - doxycycline (VIBRA-TABS) 100 MG tablet; Take 1 tablet (100 mg total) by mouth 2 (two) times daily. 1 po bid  Dispense: 14 tablet; Refill: 0   Labs pending Health Maintenance reviewed Diet and exercise encouraged  Follow up plan: 6 months   Mary-Margaret Hassell Done, FNP

## 2018-10-09 NOTE — Patient Instructions (Signed)
Infeccin urinaria en los adultos  (Urinary Tract Infection, Adult)  Una infeccin urinaria (IU) puede ocurrir en cualquier lugar de las vas urinarias. Las vas urinarias incluyen lo siguiente:   Riones.   Urteres.   Vejiga.   Uretra.  Estos rganos fabrican, almacenan y eliminan la orina del organismo.  CUIDADOS EN EL HOGAR   Tome los medicamentos de venta libre y los recetados solamente como se lo haya indicado el mdico.   Si le recetaron un antibitico, tmelo como se lo haya indicado el mdico. No deje de tomar los antibiticos aunque comience a sentirse mejor.   Evite beber lo siguiente:  ? Alcohol.  ? Cafena.  ? T.  ? Bebidas con gas.   Beba suficiente lquido para mantener el pis claro o de color amarillo plido.   Concurra a todas las visitas de control como se lo haya indicado el mdico. Esto es importante.   Asegrese de lo siguiente:  ? Vaciar la vejiga con frecuencia y en su totalidad. No contener la orina durante largos perodos.  ? Vaciar la vejiga antes y despus de tener relaciones sexuales.  ? Limpiar de adelante hacia atrs despus de defecar, si es mujer. Usar cada trozo de papel una vez cuando se limpie.  SOLICITE AYUDA SI:   Siente dolor en la espalda.   Tiene fiebre.   Siente malestar estomacal (nuseas).   Vomita.   Los sntomas no mejoran despus de 3das de tratamiento.   Los sntomas desaparecen y luego reaparecen.  SOLICITE AYUDA DE INMEDIATO SI:   Siente un dolor muy intenso en la espalda.   Siente un dolor muy intenso en la parte inferior del abdomen.   Tiene vmitos y no puede retener los medicamentos ni el agua.  Esta informacin no tiene como fin reemplazar el consejo del mdico. Asegrese de hacerle al mdico cualquier pregunta que tenga.  Document Released: 03/30/2010 Document Revised: 02/01/2016 Document Reviewed: 08/31/2015  Elsevier Interactive Patient Education  2018 [employer]

## 2018-10-18 ENCOUNTER — Telehealth: Payer: Self-pay | Admitting: Nurse Practitioner

## 2018-10-18 NOTE — Telephone Encounter (Signed)
What is the name of the medication? Needs medication for jock itch for sweating between legs and rolls of fat  Have you contacted your pharmacy to request a refill? No never had any befire  Which pharmacy would you like this sent to? [facility] in [place]   Patient notified that their request is being sent to the clinical staff for review and that they should receive a call once it is complete. If they do not receive a call within 24 hours they can check with their pharmacy or our office.

## 2018-10-19 MED ORDER — NYSTATIN 100000 UNIT/GM EX CREA: 1.0000 "application " | TOPICAL_CREAM | Freq: Two times a day (BID) | CUTANEOUS | 1 refills | Status: AC

## 2018-10-19 NOTE — Telephone Encounter (Signed)
Nystatin cream rx sent to pharmacy 

## 2019-12-20 ENCOUNTER — Other Ambulatory Visit: Payer: Self-pay | Admitting: Nurse Practitioner

## 2019-12-20 DIAGNOSIS — E039 Hypothyroidism, unspecified: Secondary | ICD-10-CM

## 2019-12-24 ENCOUNTER — Encounter: Payer: Self-pay | Admitting: Nurse Practitioner

## 2019-12-24 ENCOUNTER — Ambulatory Visit (INDEPENDENT_AMBULATORY_CARE_PROVIDER_SITE_OTHER): Payer: Medicare PPO | Admitting: Nurse Practitioner

## 2019-12-24 DIAGNOSIS — E782 Mixed hyperlipidemia: Secondary | ICD-10-CM | POA: Diagnosis not present

## 2019-12-24 DIAGNOSIS — E559 Vitamin D deficiency, unspecified: Secondary | ICD-10-CM | POA: Diagnosis not present

## 2019-12-24 DIAGNOSIS — I6523 Occlusion and stenosis of bilateral carotid arteries: Secondary | ICD-10-CM

## 2019-12-24 DIAGNOSIS — E039 Hypothyroidism, unspecified: Secondary | ICD-10-CM | POA: Diagnosis not present

## 2019-12-24 MED ORDER — LEVOTHYROXINE SODIUM 112 MCG PO TABS: ORAL_TABLET | ORAL | 1 refills | Status: DC

## 2019-12-24 NOTE — Progress Notes (Signed)
Virtual Visit via telephone Note Due to COVID-19 pandemic this visit was conducted virtually. This visit type was conducted due to national recommendations for restrictions regarding the COVID-19 Pandemic (e.g. social distancing, sheltering in place) in an effort to limit this patient's exposure and mitigate transmission in our community. All issues noted in this document were discussed and addressed.  A physical exam was not performed with this format.  I connected with Melinda Mitchell on 12/24/19 at 8:55 by telephone and verified that I am speaking with the correct person using two identifiers. Melinda Mitchell is currently located at home and no one is currently with her during visit. The provider, Mary-Margaret Hassell Done, FNP is located in their office at time of visit.  I discussed the limitations, risks, security and privacy concerns of performing an evaluation and management service by telephone and the availability of in person appointments. I also discussed with the patient that there may be a patient responsible charge related to this service. The patient expressed understanding and agreed to proceed.   History and Present Illness:   Chief Complaint: Medical Management of Chronic Issues    HPI:  1. Mixed hyperlipidemia Does watch fried foods in diet. Does not do much exercise. Lab Results  Component Value Date   CHOL 230 (H) 09/18/2018   HDL 60 09/18/2018   LDLCALC 138 (H) 09/18/2018   TRIG 162 (H) 09/18/2018   CHOLHDL 3.8 09/18/2018     2. Hypothyroidism (acquired) No problems if aware of Lab Results  Component Value Date   TSH 0.614 09/18/2018     3. Bilateral carotid artery stenosis Her last carotid doppler was done in 2016. She had significant blockage on right. She refused to see vascular surgeon. She does not want to have repeated right now.  4. Vitamin D insufficiency Taking vitamin d supplement daily.  5. Morbid obesity ([place]) Weight is up about 10lbs since last  visit according to patient. Wt Readings from Last 3 Encounters:  10/09/18 163 lb (73.9 kg)  09/18/18 163 lb (73.9 kg)  09/28/17 180 lb (81.6 kg)   BMI Readings from Last 3 Encounters:  10/09/18 29.81 kg/m  09/18/18 29.81 kg/m  09/28/17 32.92 kg/m       Outpatient Encounter Medications as of 12/24/2019  Medication Sig  . acetaminophen (TYLENOL) 325 MG tablet Take 325 mg by mouth every 6 (six) hours as needed for pain.  Marland Kitchen aspirin 325 MG tablet Take 325 mg by mouth as needed.   . cyclobenzaprine (FLEXERIL) 5 MG tablet TAKE 1 TABLET THREE TIMES DAILY AS NEEDED FOR MUSCLE SPASMS.  Marland Kitchen doxycycline (VIBRA-TABS) 100 MG tablet Take 1 tablet (100 mg total) by mouth 2 (two) times daily. 1 po bid  . ibuprofen (ADVIL,MOTRIN) 200 MG tablet Take 200 mg by mouth every 6 (six) hours as needed.  Marland Kitchen levothyroxine (SYNTHROID, LEVOTHROID) 112 MCG tablet TAKE 1 TABLET BY MOUTH DAILY  . nystatin cream (MYCOSTATIN) Apply 1 application topically 2 (two) times daily.     Past Surgical History:  Procedure Laterality Date  . ABDOMINAL HYSTERECTOMY    . branch vein occlusion  Left     Family History  Problem Relation Age of Onset  . COPD Mother   . Asthma Mother   . Congestive Heart Failure Mother   . Coronary artery disease Father   . Heart attack Father   . COPD Brother   . Heart attack Brother   . Coronary artery disease Brother     New complaints:  None today  Social history: Lives by herself- family checks on her  Controlled substance contract: n/a    Review of Systems  Constitutional: Negative for diaphoresis and weight loss.  Eyes: Negative for blurred vision, double vision and pain.  Respiratory: Negative for shortness of breath.   Cardiovascular: Negative for chest pain, palpitations, orthopnea and leg swelling.  Gastrointestinal: Negative for abdominal pain.  Skin: Negative for rash.  Neurological: Negative for dizziness, sensory change, loss of consciousness, weakness and  headaches.  Endo/Heme/Allergies: Negative for polydipsia. Does not bruise/bleed easily.  Psychiatric/Behavioral: Negative for memory loss. The patient does not have insomnia.   All other systems reviewed and are negative.    Observations/Objective: Alert and oriented- answers all questions appropriately No distress    Assessment and Plan: [name] comes in today with chief complaint of Medical Management of Chronic Issues   Diagnosis and orders addressed:  1. Mixed hyperlipidemia Refuse a statin Encouraged to at least add red yeast rice.  2. Hypothyroidism (acquired) Refuses to come in for labs due  To covid  3. Bilateral carotid artery stenosis Refuses to have repeat doppler study, despite the possibility of having a stroke. She will continue her daily 325mg  aspirin  4. Vitamin D insufficiency Continue daily vitamin d supplement  5. Morbid obesity ([facility]) Discussed diet and exercise for person with BMI >25 Will recheck weight in 3-6 months   Labs pending Health Maintenance reviewed Diet and exercise encouraged  Follow up plan: 3 months      I discussed the assessment and treatment plan with the patient. The patient was provided an opportunity to ask questions and all were answered. The patient agreed with the plan and demonstrated an understanding of the instructions.   The patient was advised to call back or seek an in-person evaluation if the symptoms worsen or if the condition fails to improve as anticipated.  The above assessment and management plan was discussed with the patient. The patient verbalized understanding of and has agreed to the management plan. Patient is aware to call the clinic if symptoms persist or worsen. Patient is aware when to return to the clinic for a follow-up visit. Patient educated on when it is appropriate to go to the emergency department.   Time call ended:  9:10  I provided 15 minutes of non-face-to-face time during this  encounter.    Mary-Margaret , FNP

## 2020-01-14 ENCOUNTER — Ambulatory Visit (INDEPENDENT_AMBULATORY_CARE_PROVIDER_SITE_OTHER): Payer: Medicare PPO

## 2020-01-14 DIAGNOSIS — Z0000 Encounter for general adult medical examination without abnormal findings: Secondary | ICD-10-CM

## 2020-01-14 NOTE — Progress Notes (Signed)
MEDICARE ANNUAL WELLNESS VISIT  01/14/2020  Telephone Visit Disclaimer This Medicare AWV was conducted by telephone due to national recommendations for restrictions regarding the COVID-19 Pandemic (e.g. social distancing).  I verified, using two identifiers, that I am speaking with Melinda Mitchell or their authorized healthcare agent. I discussed the limitations, risks, security, and privacy concerns of performing an evaluation and management service by telephone and the potential availability of an in-person appointment in the future. The patient expressed understanding and agreed to proceed.   Subjective:  Melinda Mitchell is a 74 y.o. female patient of Melinda Pierini, FNP who had a Medicare Annual Wellness Visit today via telephone. 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  She does not have any children.  She reports that she is socially active and does interact with friends/family regularly.  is minimally physically active and enjoys reading in her spare time.  Patient Care Team: Melinda Pierini, FNP as PCP - General (Nurse Practitioner)  Advanced Directives 01/14/2020 07/02/2015  Does Patient Have a Medical Advance Directive? Yes Yes  Type of Advance Directive Healthcare Power of Attorney -  Does patient want to make changes to medical advance directive? No - Patient declined -  Copy of Healthcare Power of Attorney in Chart? No - copy requested No - copy requested    Hospital Utilization Over the Past 12 Months: # of hospitalizations or ER visits: 0 # of surgeries: 0  Review of Systems    Patient reports that her overall health is worse compared to last year because of her back pain.  History obtained from chart review and the patient  Patient Reported Readings (BP, Pulse, CBG, Weight, etc) none  Pain Assessment Pain  : 0-10 Pain Score: 7  Pain Location: Back Pain Orientation: Lower Pain Descriptors / Indicators: Dull, Sore Pain Onset: More than a month ago(ongoing for about 2 years) Pain Frequency: Constant Pain Relieving Factors: Tylenol 650 mg three times a day Effect of Pain on Daily Activities: Tries to push through pain  Pain Relieving Factors: Tylenol 650 mg three times a day  Current Medications & Allergies (verified) Allergies as of 01/14/2020      Reactions   Cephalosporins    Keflex [cephalexin] Itching   Mobic [meloxicam]    Nsaids    States that NSAID's affected kidney function and caused increased potassium   Statins    myalgias   Penicillins Hives, Itching, Rash      Medication List       Accurate as of January 14, 2020  1:57 PM. If you have any questions, ask your nurse or doctor.        STOP taking these medications   ibuprofen 200 MG tablet Commonly known as: ADVIL     TAKE these medications   acetaminophen 325 MG tablet Commonly known as: TYLENOL Take 325 mg by mouth every 6 (six) hours as needed for pain.   aspirin 325 MG tablet Take 325 mg by mouth daily.   cholecalciferol 25 MCG (1000 UNIT) tablet Commonly known as: VITAMIN D3 Take 1,000 Units by mouth daily.   cyclobenzaprine 5 MG tablet Commonly known as: FLEXERIL TAKE 1 TABLET THREE TIMES DAILY AS NEEDED FOR MUSCLE SPASMS.   levothyroxine 112 MCG tablet Commonly known as: SYNTHROID TAKE 1 TABLET BY MOUTH DAILY   nystatin cream Commonly  known as: MYCOSTATIN Apply 1 application topically 2 (two) times daily.   vitamin B-12 1000 MCG tablet Commonly known as: CYANOCOBALAMIN Take 1,000 mcg by mouth daily.       History (reviewed): Past Medical History:  Diagnosis Date  . Bilateral carotid artery stenosis   . Dyslipidemia   . Hypothyroidism   . Vitamin B12 deficiency    Past Surgical History:  Procedure Laterality Date  . ABDOMINAL HYSTERECTOMY     total  . branch vein occlusion  Left    . THYROID SURGERY     Family History  Problem Relation Age of Onset  . COPD Mother   . Asthma Mother   . Congestive Heart Failure Mother   . Heart disease Mother   . Goiter Mother   . Thyroid disease Mother   . Coronary artery disease Father   . Heart attack Father   . Heart disease Father   . COPD Brother   . Heart attack Brother   . Coronary artery disease Brother   . Heart disease Brother   . Stroke Maternal Grandmother   . Stroke Maternal Grandfather   . Heart disease Brother        heart murmur  . Heart disease Brother        heart murmur   Social History   Socioeconomic History  . Marital status: Married    Spouse name: Melinda Mitchell  . Number of children: 0  . Years of education: masters degree  . Highest education level: Master's degree (e.g., MA, MS, MEng, MEd, MSW, MBA)  Occupational History  . Occupation: Education officer, museum  . Occupation: Public relations account executive  . Occupation: Retired  Tobacco Use  . Smoking status: Current Every Day Smoker    Packs/day: 2.00    Years: 25.00    Pack years: 50.00    Types: Cigarettes    Start date: 10/24/1990  . Smokeless tobacco: Never Used  Substance and Sexual Activity  . Alcohol use: No  . Drug use: No  . Sexual activity: Not Currently  Other Topics Concern  . Not on file  Social History Narrative  . Not on file   Social Determinants of Health   Financial Resource Strain:   . Difficulty of Paying Living Expenses:   Food Insecurity:   . Worried About Charity fundraiser in the Last Year:   . Arboriculturist in the Last Year:   Transportation Needs:   . Film/video editor (Medical):   Marland Kitchen Lack of Transportation (Non-Medical):   Physical Activity:   . Days of Exercise per Week:   . Minutes of Exercise per Session:   Stress:   . Feeling of Stress :   Social Connections:   . Frequency of Communication with Friends and Family:   . Frequency of Social Gatherings with Friends and Family:   . Attends Religious Services:     . Active Member of Clubs or Organizations:   . Attends Archivist Meetings:   Marland Kitchen Marital Status:     Activities of Daily Living In your present state of health, do you have any difficulty performing the following activities: 01/14/2020 01/14/2020  Hearing? Y Y  Comment - reports slight hearing loss  Vision? Y Y  Comment visual changes in left eye some visual changes in left eye  Difficulty concentrating or making decisions? N N  Walking or climbing stairs? N -  Dressing or bathing? N -  Doing errands, shopping? N -  Preparing Food and eating ? N -  Using the Toilet? N -  In the past six months, have you accidently leaked urine? N -  Do you have problems with loss of bowel control? N -  Managing your Medications? N -  Managing your Finances? N -  Housekeeping or managing your Housekeeping? N -  Some recent data might be hidden    Patient Education/ Literacy How often do you need to have someone help you when you read instructions, pamphlets, or other written materials from your doctor or pharmacy?: 1 - Never What is the last grade level you completed in school?: masters degree  Exercise Current Exercise Habits: The patient does not participate in regular exercise at present, Exercise limited by: respiratory conditions(s);orthopedic condition(s)  Diet Patient reports consuming 2 meals a day and 2 snack(s) a day Patient reports that her primary diet is: Regular Patient reports that she does not have regular access to food.   Depression Screen PHQ 2/9 Scores 01/14/2020 10/09/2018 09/18/2018 09/28/2017 09/19/2016 04/25/2016 01/21/2016  PHQ - 2 Score 0 0 1 0 0 0 0  Exception Documentation - - - - - - -     Fall Risk Fall Risk  01/14/2020 10/09/2018 09/28/2017 09/19/2016 04/25/2016  Falls in the past year? 0 0 No No No     Objective:  Melinda Mitchell seemed alert and oriented and she participated appropriately during our telephone visit.  Blood Pressure Weight BMI  BP  Readings from Last 3 Encounters:  10/09/18 (!) 143/71  09/18/18 (!) 162/75  09/28/17 (!) 148/76   Wt Readings from Last 3 Encounters:  10/09/18 163 lb (73.9 kg)  09/18/18 163 lb (73.9 kg)  09/28/17 180 lb (81.6 kg)   BMI Readings from Last 1 Encounters:  10/09/18 29.81 kg/m    *Unable to obtain current vital signs, weight, and BMI due to telephone visit type  Hearing/Vision  . [place] did not seem to have difficulty with hearing/understanding during the telephone conversation . Reports that she has not had a formal eye exam by an eye care professional within the past year . Reports that she has not had a formal hearing evaluation within the past year *Unable to fully assess hearing and vision during telephone visit type  Cognitive Function: 6CIT Screen 01/14/2020  What Year? 0 points  What month? 0 points  What time? 0 points  Count back from 20 0 points  Months in reverse 0 points  Repeat phrase 0 points  Total Score 0   (Normal:0-7, Significant for Dysfunction: >8)  Normal Cognitive Function Screening: Yes   Immunization & Health Maintenance Record Immunization History  Administered Date(s) Administered  . Influenza-Unspecified 07/24/2009, 07/30/2010, 07/04/2011, 07/26/2012, 07/24/2013, 08/05/2014, 08/13/2015, 07/25/2016, 08/01/2017, 08/08/2018  . Pneumococcal Conjugate-13 04/25/2016  . Pneumococcal Polysaccharide-23 10/16/2013  . Td 07/04/2011  . Tdap 07/04/2011    Health Maintenance  Topic Date Due  . COLON CANCER SCREENING ANNUAL FOBT  Never done  . COLONOSCOPY  Never done  . MAMMOGRAM  11/20/2015  . INFLUENZA VACCINE  05/25/2019  . TETANUS/TDAP  07/03/2021  . DEXA SCAN  Completed  . Hepatitis C Screening  Completed  . PNA vac Low Risk Adult  Completed       Assessment  This is a routine wellness examination for [facility].  Health Maintenance: Due or Overdue Health Maintenance Due  Topic Date Due  . COLON CANCER SCREENING ANNUAL FOBT  Never done  .  COLONOSCOPY  Never done  .  MAMMOGRAM  11/20/2015  . INFLUENZA VACCINE  05/25/2019    Melinda Mitchell does not need a referral for Community Assistance: Care Management:   no Social Work:    no Prescription Assistance:  no Nutrition/Diabetes Education:  no   Plan:  Personalized Goals Goals Addressed            This Visit's Progress   . Patient Stated       01/14/2020 AWV Goal: Improved Nutrition/Diet  . Patient will verbalize understanding that diet plays an important role in overall health and that a poor diet is a risk factor for many chronic medical conditions.  . Over the next year, patient will improve self management of their diet by incorporating better variety. . Patient will utilize available community resources to help with food acquisition if needed (ex: food pantries, [address], etc) . Patient will work with nutrition specialist if a referral was made       Personalized Health Maintenance & Screening Recommendations  Bone densitometry screening Colorectal cancer screening  Lung Cancer Screening Recommended: yes (Low Dose CT Chest recommended if Age 70-80 years, 30 pack-year currently smoking OR have quit w/in past 15 years) Hepatitis C Screening recommended: yes HIV Screening recommended: no  Advanced Directives: Written information was not prepared per patient's request.  Referrals & Orders No orders of the defined types were placed in this encounter.   Follow-up Plan . Follow-up with Melinda Pierini, FNP as planned . Schedule colonoscopy . Schedule dexa scan . Schedule eye exam   I have personally reviewed and noted the following in the patient's chart:   . Medical and social history . Use of alcohol, tobacco or illicit drugs  . Current medications and supplements . Functional ability and status . Nutritional status . Physical activity . Advanced directives . List of other physicians . Hospitalizations, surgeries, and ER visits in previous 12  months . Vitals . Screenings to include cognitive, depression, and falls . Referrals and appointments  In addition, I have reviewed and discussed with Melinda Mitchell certain preventive protocols, quality metrics, and best practice recommendations. A written personalized care plan for preventive services as well as general preventive health recommendations is available and can be mailed to the patient at her request.      Mariam Dollar, LPN    1/[id number]/[id number]

## 2020-03-26 ENCOUNTER — Ambulatory Visit: Payer: Self-pay | Admitting: Nurse Practitioner

## 2020-06-23 ENCOUNTER — Other Ambulatory Visit: Payer: Self-pay | Admitting: Nurse Practitioner

## 2020-06-23 DIAGNOSIS — E039 Hypothyroidism, unspecified: Secondary | ICD-10-CM

## 2020-06-23 MED ORDER — LEVOTHYROXINE SODIUM 112 MCG PO TABS: ORAL_TABLET | ORAL | 0 refills | Status: DC

## 2020-06-23 NOTE — Telephone Encounter (Signed)
Pt's last OV 12/24/19, Last TSH 09/18/18 States she is not able to get and wants to wait till she gets her 3rd Covid shot and would just like her medication refilled for now

## 2020-06-23 NOTE — Telephone Encounter (Signed)
°  Prescription Request  06/23/2020  What is the name of the medication or equipment? levothyroxine (SYNTHROID) 112 MCG tablet   Have you contacted your pharmacy to request a refill? (if applicable) yes  Which pharmacy would you like this sent to? [facility]   Patient notified that their request is being sent to the clinical staff for review and that they should receive a response within 2 business days.

## 2020-06-30 ENCOUNTER — Telehealth: Payer: Self-pay | Admitting: Nurse Practitioner

## 2020-06-30 DIAGNOSIS — E039 Hypothyroidism, unspecified: Secondary | ICD-10-CM

## 2020-06-30 MED ORDER — LEVOTHYROXINE SODIUM 112 MCG PO TABS: ORAL_TABLET | ORAL | 0 refills | Status: DC

## 2020-06-30 NOTE — Telephone Encounter (Signed)
°  Prescription Request  06/30/2020  What is the name of the medication or equipment? Pt needs 90 supply of thyroid meds. Pt said that only 30 days was called in and insurance pays for 90 days  Have you contacted your pharmacy to request a refill? (if applicable) yes  Which pharmacy would you like this sent to? [facility] in [place]   Patient notified that their request is being sent to the clinical staff for review and that they should receive a response within 2 business days.

## 2020-06-30 NOTE — Telephone Encounter (Signed)
Appt made

## 2020-07-17 ENCOUNTER — Ambulatory Visit (INDEPENDENT_AMBULATORY_CARE_PROVIDER_SITE_OTHER): Payer: Medicare PPO | Admitting: Nurse Practitioner

## 2020-07-17 ENCOUNTER — Encounter: Payer: Self-pay | Admitting: Nurse Practitioner

## 2020-07-17 DIAGNOSIS — E039 Hypothyroidism, unspecified: Secondary | ICD-10-CM | POA: Diagnosis not present

## 2020-07-17 DIAGNOSIS — E559 Vitamin D deficiency, unspecified: Secondary | ICD-10-CM

## 2020-07-17 DIAGNOSIS — E782 Mixed hyperlipidemia: Secondary | ICD-10-CM | POA: Diagnosis not present

## 2020-07-17 DIAGNOSIS — I6523 Occlusion and stenosis of bilateral carotid arteries: Secondary | ICD-10-CM | POA: Diagnosis not present

## 2020-07-17 DIAGNOSIS — R0989 Other specified symptoms and signs involving the circulatory and respiratory systems: Secondary | ICD-10-CM

## 2020-07-17 MED ORDER — LEVOTHYROXINE SODIUM 112 MCG PO TABS: ORAL_TABLET | ORAL | 1 refills | Status: DC

## 2020-07-17 NOTE — Addendum Note (Signed)
Addended by: Bennie Pierini on: 07/17/2020 03:33 PM   Modules accepted: Orders

## 2020-07-17 NOTE — Progress Notes (Addendum)
Virtual Visit via video Note   Due to COVID-19 pandemic this visit was conducted virtually. This visit type was conducted due to national recommendations for restrictions regarding the COVID-19 Pandemic (e.g. social distancing, sheltering in place) in an effort to limit this patient's exposure and mitigate transmission in our community. All issues noted in this document were discussed and addressed.  A physical exam was not performed with this format.  I connected with  Melinda Mitchell  on 07/17/20 at 3:00 by video and verified that I am speaking with the correct person using two identifiers. Melinda Mitchell is currently located at home and  Non one is currently with her during visit. The provider, Mary-Margaret Hassell Done, FNP is located in their office at time of visit.  I discussed the limitations, risks, security and privacy concerns of performing an evaluation and management service by video  and the availability of in person appointments. I also discussed with the patient that there may be a patient responsible charge related to this service. The patient expressed understanding and agreed to proceed.   History and Present Illness:  Subjective:    Patient ID: Melinda Mitchell, female    DOB: 1946-01-02, 74 y.o.   MRN: [id number]   Chief Complaint: Medical Management of Chronic Issues    HPI:  1. Mixed hyperlipidemia Does try to watch diet . But does little to no exercise. Refuses to take statin. Lab Results  Component Value Date   CHOL 230 (H) 09/18/2018   HDL 60 09/18/2018   LDLCALC 138 (H) 09/18/2018   TRIG 162 (H) 09/18/2018   CHOLHDL 3.8 09/18/2018     2. Hypothyroidism (acquired) No problems that she is aware of Lab Results  Component Value Date   TSH 0.614 09/18/2018    3. Bruit of right carotid artery No c/o dizziness or syncope  4. Bilateral carotid artery stenosis  5. Vitamin D insufficiency Is on daily vitamind supplement  6. Morbid obesity ([place]) No recent weight  changes Wt Readings from Last 3 Encounters:  10/09/18 163 lb (73.9 kg)  09/18/18 163 lb (73.9 kg)  09/28/17 180 lb (81.6 kg)   BMI Readings from Last 3 Encounters:  10/09/18 29.81 kg/m  09/18/18 29.81 kg/m  09/28/17 32.92 kg/m       Outpatient Encounter Medications as of 07/17/2020  Medication Sig  . acetaminophen (TYLENOL) 325 MG tablet Take 325 mg by mouth every 6 (six) hours as needed for pain.  Marland Kitchen aspirin 325 MG tablet Take 325 mg by mouth daily.   . cholecalciferol (VITAMIN D3) 25 MCG (1000 UNIT) tablet Take 1,000 Units by mouth daily.  . cyclobenzaprine (FLEXERIL) 5 MG tablet TAKE 1 TABLET THREE TIMES DAILY AS NEEDED FOR MUSCLE SPASMS.  Marland Kitchen levothyroxine (SYNTHROID) 112 MCG tablet TAKE 1 TABLET BY MOUTH DAILY  . nystatin cream (MYCOSTATIN) Apply 1 application topically 2 (two) times daily. (Patient not taking: Reported on 01/14/2020)  . vitamin B-12 (CYANOCOBALAMIN) 1000 MCG tablet Take 1,000 mcg by mouth daily.    Past Surgical History:  Procedure Laterality Date  . ABDOMINAL HYSTERECTOMY     total  . branch vein occlusion  Left   . THYROID SURGERY      Family History  Problem Relation Age of Onset  . COPD Mother   . Asthma Mother   . Congestive Heart Failure Mother   . Heart disease Mother   . Goiter Mother   . Thyroid disease Mother   . Coronary artery disease Father   .  Heart attack Father   . Heart disease Father   . COPD Brother   . Heart attack Brother   . Coronary artery disease Brother   . Heart disease Brother   . Stroke Maternal Grandmother   . Stroke Maternal Grandfather   . Heart disease Brother        heart murmur  . Heart disease Brother        heart murmur    New complaints: None today  Social history: Lives with her husband  Controlled substance contract: n/a   Review of Systems  Constitutional: Negative for diaphoresis and weight loss.  Eyes: Negative for blurred vision, double vision and pain.  Respiratory: Negative for  shortness of breath.   Cardiovascular: Negative for chest pain, palpitations, orthopnea and leg swelling.  Gastrointestinal: Negative for abdominal pain.  Skin: Negative for rash.  Neurological: Negative for dizziness, sensory change, loss of consciousness, weakness and headaches.  Endo/Heme/Allergies: Negative for polydipsia. Does not bruise/bleed easily.  Psychiatric/Behavioral: Negative for memory loss. The patient does not have insomnia.   All other systems reviewed and are negative.      Observations/Objective: Alert and oriented- answers all questions appropriately No distress    Assessment and Plan: Melinda Mitchell comes in today with chief complaint of Medical Management of Chronic Issues   Diagnosis and orders addressed:  1. Mixed hyperlipidemia Low fat diet  2. Hypothyroidism (acquired) Needs to come in for labs - levothyroxine (SYNTHROID) 112 MCG tablet; TAKE 1 TABLET BY MOUTH DAILY  Dispense: 90 tablet; Refill: 1  3. Bruit of right carotid artery  4. Bilateral carotid artery stenosis  5. Vitamin D insufficiency Continue daily vitamin d supplement  6. Morbid obesity ([place]) Discussed diet and exercise for person with BMI >25 Will recheck weight in 3-6 months  Orders Placed This Encounter  Procedures  . CBC with Differential/Platelet    Standing Status:   Future    Standing Expiration Date:   07/17/2021  . CMP14+EGFR    Standing Status:   Future    Standing Expiration Date:   07/17/2021  . Thyroid Panel With TSH    Standing Status:   Future    Standing Expiration Date:   07/17/2021  . Lipid panel    Standing Status:   Future    Standing Expiration Date:   07/17/2021    Labs pending Health Maintenance reviewed Diet and exercise encouraged  Follow up plan: 3 months    I discussed the assessment and treatment plan with the patient. The patient was provided an opportunity to ask questions and all were answered. The patient agreed with the plan and  demonstrated an understanding of the instructions.   The patient was advised to call back or seek an in-person evaluation if the symptoms worsen or if the condition fails to improve as anticipated.  The above assessment and management plan was discussed with the patient. The patient verbalized understanding of and has agreed to the management plan. Patient is aware to call the clinic if symptoms persist or worsen. Patient is aware when to return to the clinic for a follow-up visit. Patient educated on when it is appropriate to go to the emergency department.   Time call ended:3:20  I provided 20 minutes of face-to-face time during this encounter.    Mary-Margaret Hassell Done, FNP

## 2020-09-28 ENCOUNTER — Other Ambulatory Visit: Payer: Medicare PPO

## 2020-09-28 ENCOUNTER — Other Ambulatory Visit: Payer: Self-pay

## 2020-09-28 DIAGNOSIS — E039 Hypothyroidism, unspecified: Secondary | ICD-10-CM

## 2020-09-28 DIAGNOSIS — E782 Mixed hyperlipidemia: Secondary | ICD-10-CM

## 2020-09-29 ENCOUNTER — Other Ambulatory Visit: Payer: Self-pay | Admitting: Nurse Practitioner

## 2020-09-29 LAB — LIPID PANEL
Chol/HDL Ratio: 3.7 ratio (ref 0.0–4.4)
Cholesterol, Total: 241 mg/dL — ABNORMAL HIGH (ref 100–199)
HDL: 66 mg/dL (ref >39)
LDL Chol Calc (NIH): 143 mg/dL — ABNORMAL HIGH (ref 0–99)
Triglycerides: 178 mg/dL — ABNORMAL HIGH (ref 0–149)
VLDL Cholesterol Cal: 32 mg/dL (ref 5–40)

## 2020-09-29 LAB — CBC WITH DIFFERENTIAL/PLATELET
Basophils Absolute: 0.1 10*3/uL (ref 0.0–0.2)
Basos: 1 %
EOS (ABSOLUTE): 0.2 10*3/uL (ref 0.0–0.4)
Eos: 3 %
Hematocrit: 48.4 % — ABNORMAL HIGH (ref 34.0–46.6)
Hemoglobin: 17.1 g/dL — ABNORMAL HIGH (ref 11.1–15.9)
Immature Grans (Abs): 0 10*3/uL (ref 0.0–0.1)
Immature Granulocytes: 0 %
Lymphocytes Absolute: 1.8 10*3/uL (ref 0.7–3.1)
Lymphs: 24 %
MCH: 32.1 pg (ref 26.6–33.0)
MCHC: 35.3 g/dL (ref 31.5–35.7)
MCV: 91 fL (ref 79–97)
Monocytes Absolute: 0.3 10*3/uL (ref 0.1–0.9)
Monocytes: 4 %
Neutrophils Absolute: 5.2 10*3/uL (ref 1.4–7.0)
Neutrophils: 68 %
Platelets: 276 10*3/uL (ref 150–450)
RBC: 5.32 x10E6/uL — ABNORMAL HIGH (ref 3.77–5.28)
RDW: 12.5 % (ref 11.7–15.4)
WBC: 7.5 10*3/uL (ref 3.4–10.8)

## 2020-09-29 LAB — CMP14+EGFR
ALT: 22 IU/L (ref 0–32)
AST: 21 IU/L (ref 0–40)
Albumin/Globulin Ratio: 1.8 (ref 1.2–2.2)
Albumin: 4.3 g/dL (ref 3.7–4.7)
Alkaline Phosphatase: 82 IU/L (ref 44–121)
BUN/Creatinine Ratio: 15 (ref 12–28)
BUN: 13 mg/dL (ref 8–27)
Bilirubin Total: 0.6 mg/dL (ref 0.0–1.2)
CO2: 17 mmol/L — ABNORMAL LOW (ref 20–29)
Calcium: 9.6 mg/dL (ref 8.7–10.3)
Chloride: 105 mmol/L (ref 96–106)
Creatinine, Ser: 0.85 mg/dL (ref 0.57–1.00)
GFR calc Af Amer: 78 mL/min/{1.73_m2} (ref >59)
GFR calc non Af Amer: 68 mL/min/{1.73_m2} (ref >59)
Globulin, Total: 2.4 g/dL (ref 1.5–4.5)
Glucose: 126 mg/dL — ABNORMAL HIGH (ref 65–99)
Potassium: 4.4 mmol/L (ref 3.5–5.2)
Sodium: 142 mmol/L (ref 134–144)
Total Protein: 6.7 g/dL (ref 6.0–8.5)

## 2020-09-29 LAB — THYROID PANEL WITH TSH
Free Thyroxine Index: 3.8 (ref 1.2–4.9)
T3 Uptake Ratio: 30 % (ref 24–39)
T4, Total: 12.7 ug/dL — ABNORMAL HIGH (ref 4.5–12.0)
TSH: 0.22 u[IU]/mL — ABNORMAL LOW (ref 0.450–4.500)

## 2020-09-29 MED ORDER — LEVOTHYROXINE SODIUM 100 MCG PO TABS: 100.0000 ug | ORAL_TABLET | Freq: Every day | ORAL | 3 refills | Status: AC

## 2021-06-29 ENCOUNTER — Telehealth: Payer: Self-pay | Admitting: Nurse Practitioner
